# Patient Record
Sex: Male | Born: 1965 | Race: White | Hispanic: No | Marital: Married | Smoking: Never smoker
Health system: Southern US, Community
[De-identification: ages and names within clinical notes are randomized; demographics above are authoritative.]

## PROBLEM LIST (undated history)

## (undated) DIAGNOSIS — J189 Pneumonia, unspecified organism: Secondary | ICD-10-CM

## (undated) DIAGNOSIS — K5792 Diverticulitis of intestine, part unspecified, without perforation or abscess without bleeding: Secondary | ICD-10-CM

## (undated) DIAGNOSIS — K259 Gastric ulcer, unspecified as acute or chronic, without hemorrhage or perforation: Secondary | ICD-10-CM

## (undated) DIAGNOSIS — J302 Other seasonal allergic rhinitis: Secondary | ICD-10-CM

---

## 2001-12-09 ENCOUNTER — Encounter: Payer: Self-pay | Admitting: Gastroenterology

## 2001-12-09 ENCOUNTER — Encounter: Admission: RE | Admit: 2001-12-09 | Discharge: 2001-12-09 | Payer: Self-pay | Admitting: Gastroenterology

## 2010-09-21 ENCOUNTER — Encounter: Payer: Self-pay | Admitting: Family Medicine

## 2012-07-21 ENCOUNTER — Ambulatory Visit (INDEPENDENT_AMBULATORY_CARE_PROVIDER_SITE_OTHER): Payer: 59 | Admitting: Family Medicine

## 2012-07-21 ENCOUNTER — Encounter: Payer: Self-pay | Admitting: Family Medicine

## 2012-07-21 VITALS — BP 130/100 | HR 88 | Temp 98.4°F | Resp 12 | Ht 68.75 in | Wt 176.0 lb

## 2012-07-21 DIAGNOSIS — J4520 Mild intermittent asthma, uncomplicated: Secondary | ICD-10-CM | POA: Insufficient documentation

## 2012-07-21 DIAGNOSIS — Z8711 Personal history of peptic ulcer disease: Secondary | ICD-10-CM | POA: Insufficient documentation

## 2012-07-21 DIAGNOSIS — M546 Pain in thoracic spine: Secondary | ICD-10-CM

## 2012-07-21 DIAGNOSIS — M549 Dorsalgia, unspecified: Secondary | ICD-10-CM

## 2012-07-21 DIAGNOSIS — J45909 Unspecified asthma, uncomplicated: Secondary | ICD-10-CM

## 2012-07-21 MED ORDER — METHOCARBAMOL 500 MG PO TABS: 500.0000 mg | ORAL_TABLET | Freq: Four times a day (QID) | ORAL | Status: DC | PRN

## 2012-07-21 NOTE — Patient Instructions (Addendum)
Consider topical such as Biofreeze Consider muscle massage. Use topical heat  Touch base in 1-2 weeks if no better.

## 2012-07-21 NOTE — Progress Notes (Signed)
  Subjective:    Patient ID: Adam Oconnell, male    DOB: 1966-03-03, 47 y.o.   MRN: [id number]  HPI New patient to establish care Past medical history reviewed. History of mild intermittent asthma and rarely takes albuterol Asthma usually triggered by allergies. Takes no regular medications. No prior surgeries. Family history significant for mother with uterine cancer.  Patient is married. 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 Nonsmoker. Only occasional alcohol use.  Major issue today is some left upper back pain. Present for one month. No injury. Mostly achy pain left trapezius region was radiates somewhat to the shoulder but not below the shoulder region. No pain with full range of motion left shoulder. No upper extremity numbness or weakness. He's tried heat and ibuprofen with minimal relief. Symptoms are somewhat intermittent but can be worse at night  Past Medical History  Diagnosis Date  . Allergy   . Asthma   . Ulcer    No past surgical history on file.  reports that he has never smoked. He does not have any smokeless tobacco history on file. His alcohol and drug histories are not on file. family history includes Cancer (age of onset: 50) in his mother and Heart disease in his maternal uncle. No Known Allergies    Review of Systems  Constitutional: Negative for fever, chills, appetite change, fatigue and unexpected weight change.  Respiratory: Negative for cough and shortness of breath.   Cardiovascular: Negative for chest pain.  Gastrointestinal: Negative for abdominal pain.  Musculoskeletal: Negative for arthralgias.  Skin: Negative for rash.  Neurological: Negative for weakness and numbness.  Hematological: Negative for adenopathy.       Objective:   Physical Exam  Constitutional: He appears well-developed and well-nourished.  Neck: Neck supple. No thyromegaly present.  Cardiovascular: Normal rate and regular rhythm.   Pulmonary/Chest: Effort  normal and breath sounds normal. No respiratory distress. He has no wheezes. He has no rales.  Musculoskeletal: He exhibits no edema.  Full range of motion cervical spine and left shoulder Mild pain with lateral bending to the left otherwise good range of motion without pain  Lymphadenopathy:    He has no cervical adenopathy.  Neurological:  Deep tender reflexes symmetric upper extremities. Full-strength throughout.          Assessment & Plan:  #1 history of mild intermittent asthma. Stable. Continue as needed albuterol #2 left upper back pain. This sounds more muscular. Doubt cervical radiculopathy. Try topical rubs such as bio freeze. Consider muscle massage. Robaxin 500 mg every 6 hours when necessary

## 2012-11-05 ENCOUNTER — Other Ambulatory Visit (INDEPENDENT_AMBULATORY_CARE_PROVIDER_SITE_OTHER): Payer: 59

## 2012-11-05 DIAGNOSIS — Z0000 Encounter for general adult medical examination without abnormal findings: Secondary | ICD-10-CM

## 2012-11-05 LAB — BASIC METABOLIC PANEL
BUN: 15 mg/dL (ref 6–23)
CO2: 27 mEq/L (ref 19–32)
Calcium: 8.9 mg/dL (ref 8.4–10.5)
Chloride: 104 mEq/L (ref 96–112)
Creatinine, Ser: 0.9 mg/dL (ref 0.4–1.5)
GFR: 94.21 mL/min (ref >60.00)
Glucose, Bld: 101 mg/dL — ABNORMAL HIGH (ref 70–99)
Potassium: 4.3 mEq/L (ref 3.5–5.1)
Sodium: 136 mEq/L (ref 135–145)

## 2012-11-05 LAB — LIPID PANEL
Cholesterol: 201 mg/dL — ABNORMAL HIGH (ref 0–200)
HDL: 40.6 mg/dL (ref >39.00)
Total CHOL/HDL Ratio: 5
Triglycerides: 111 mg/dL (ref 0.0–149.0)
VLDL: 22.2 mg/dL (ref 0.0–40.0)

## 2012-11-05 LAB — HEPATIC FUNCTION PANEL
AST: 20 U/L (ref 0–37)
Albumin: 3.8 g/dL (ref 3.5–5.2)
Alkaline Phosphatase: 43 U/L (ref 39–117)
Total Bilirubin: 0.7 mg/dL (ref 0.3–1.2)

## 2012-11-05 LAB — CBC WITH DIFFERENTIAL/PLATELET
Basophils Absolute: 0 10*3/uL (ref 0.0–0.1)
Basophils Relative: 0.6 % (ref 0.0–3.0)
Eosinophils Absolute: 0.3 10*3/uL (ref 0.0–0.7)
Eosinophils Relative: 5.1 % — ABNORMAL HIGH (ref 0.0–5.0)
HCT: 42.8 % (ref 39.0–52.0)
Hemoglobin: 14.3 g/dL (ref 13.0–17.0)
Lymphocytes Relative: 33 % (ref 12.0–46.0)
Lymphs Abs: 2.1 10*3/uL (ref 0.7–4.0)
MCHC: 33.5 g/dL (ref 30.0–36.0)
MCV: 86.8 fl (ref 78.0–100.0)
Monocytes Absolute: 0.4 10*3/uL (ref 0.1–1.0)
Monocytes Relative: 6.4 % (ref 3.0–12.0)
Neutro Abs: 3.5 10*3/uL (ref 1.4–7.7)
Neutrophils Relative %: 54.9 % (ref 43.0–77.0)
Platelets: 298 10*3/uL (ref 150.0–400.0)
RBC: 4.93 Mil/uL (ref 4.22–5.81)
RDW: 13.7 % (ref 11.5–14.6)
WBC: 6.4 10*3/uL (ref 4.5–10.5)

## 2012-11-05 LAB — POCT URINALYSIS DIPSTICK
Bilirubin, UA: NEGATIVE
Glucose, UA: NEGATIVE
Ketones, UA: NEGATIVE
Leukocytes, UA: NEGATIVE
Nitrite, UA: NEGATIVE
Protein, UA: NEGATIVE
Spec Grav, UA: 1.02
Urobilinogen, UA: 0.2
pH, UA: 6.5

## 2012-11-05 LAB — TSH: TSH: 0.67 u[IU]/mL (ref 0.35–5.50)

## 2012-11-06 LAB — LDL CHOLESTEROL, DIRECT: Direct LDL: 151.1 mg/dL

## 2012-11-12 ENCOUNTER — Encounter: Payer: Self-pay | Admitting: Family Medicine

## 2012-11-12 ENCOUNTER — Ambulatory Visit (INDEPENDENT_AMBULATORY_CARE_PROVIDER_SITE_OTHER): Payer: 59 | Admitting: Family Medicine

## 2012-11-12 VITALS — BP 124/80 | HR 95 | Temp 97.8°F | Ht 68.0 in | Wt 178.0 lb

## 2012-11-12 DIAGNOSIS — Z0000 Encounter for general adult medical examination without abnormal findings: Secondary | ICD-10-CM

## 2012-11-12 DIAGNOSIS — R319 Hematuria, unspecified: Secondary | ICD-10-CM

## 2012-11-12 DIAGNOSIS — Z23 Encounter for immunization: Secondary | ICD-10-CM

## 2012-11-12 DIAGNOSIS — R9389 Abnormal findings on diagnostic imaging of other specified body structures: Secondary | ICD-10-CM

## 2012-11-12 DIAGNOSIS — R3989 Other symptoms and signs involving the genitourinary system: Secondary | ICD-10-CM

## 2012-11-12 LAB — POCT URINALYSIS DIPSTICK
Bilirubin, UA: NEGATIVE
Glucose, UA: NEGATIVE
Nitrite, UA: NEGATIVE
Spec Grav, UA: 1.025
pH, UA: 5.5

## 2012-11-12 NOTE — Progress Notes (Signed)
  Subjective:    Patient ID: Adam Oconnell, male    DOB: Jan 31, 1966, 47 y.o.   MRN: [id number]  HPI Patient for complete physical. Generally very healthy. History of mild intermittent asthma and questionable history of remote peptic ulcer. He thinks he had colonoscopy around age 15 secondary to some rectal bleeding. He was told was normal. Last tetanus is unknown. He has not yet had flu vaccine.  Works extremely long hours- frequently 15-16 hours per day. Very little exercise. Nonsmoker. No family history of premature CAD.  Past Medical History  Diagnosis Date  . Allergy   . Asthma   . Ulcer    No past surgical history on file.  reports that he has never smoked. He does not have any smokeless tobacco history on file. His alcohol and drug histories are not on file. family history includes Cancer (age of onset: 62) in his mother; Heart disease in his maternal uncle. No Known Allergies    Review of Systems  Constitutional: Negative for fever, chills, activity change, appetite change, fatigue and unexpected weight change.  HENT: Negative for ear pain, congestion and trouble swallowing.   Eyes: Negative for pain and visual disturbance.  Respiratory: Negative for cough, shortness of breath and wheezing.   Cardiovascular: Negative for chest pain and palpitations.  Gastrointestinal: Negative for nausea, vomiting, abdominal pain, diarrhea, constipation, blood in stool, abdominal distention and rectal pain.  Endocrine: Negative for polydipsia and polyuria.  Genitourinary: Negative for dysuria, hematuria and testicular pain.  Musculoskeletal: Negative for joint swelling and arthralgias.  Skin: Negative for rash.  Neurological: Negative for dizziness, syncope and headaches.  Hematological: Negative for adenopathy.  Psychiatric/Behavioral: Negative for confusion and dysphoric mood.       Objective:   Physical Exam  Constitutional: He is oriented to person, place, and time. He appears  well-developed and well-nourished. No distress.  HENT:  Head: Normocephalic and atraumatic.  Right Ear: External ear normal.  Left Ear: External ear normal.  Mouth/Throat: Oropharynx is clear and moist.  Eyes: Conjunctivae and EOM are normal. Pupils are equal, round, and reactive to light.  Neck: Normal range of motion. Neck supple. No thyromegaly present.  Cardiovascular: Normal rate, regular rhythm and normal heart sounds.   No murmur heard. Pulmonary/Chest: No respiratory distress. He has no wheezes. He has no rales.  Abdominal: Soft. Bowel sounds are normal. He exhibits no distension and no mass. There is no tenderness. There is no rebound and no guarding.  Genitourinary: Rectum normal.  Prostate feels slightly nodular right lobe near center  Musculoskeletal: He exhibits no edema.  Lymphadenopathy:    He has no cervical adenopathy.  Neurological: He is alert and oriented to person, place, and time. He displays normal reflexes. No cranial nerve deficit.  Skin: No rash noted.  Psychiatric: He has a normal mood and affect.          Assessment & Plan:  Complete physical. Tetanus booster given. Flu vaccine given. Labs reviewed. 1+ blood on initial urine dipstick. Repeat urinalysis. If persist will send for urine microscopy to rule out hematuria. Add PSA with exam above. He has mild hyperglycemia. We discussed implications. Work on reducing processed sugars and white starches

## 2012-11-12 NOTE — Patient Instructions (Addendum)

## 2012-11-13 LAB — URINALYSIS, MICROSCOPIC ONLY
Bacteria, UA: NONE SEEN
Casts: NONE SEEN
Crystals: NONE SEEN
Squamous Epithelial / LPF: NONE SEEN

## 2013-04-12 ENCOUNTER — Ambulatory Visit (INDEPENDENT_AMBULATORY_CARE_PROVIDER_SITE_OTHER): Payer: 59 | Admitting: Family Medicine

## 2013-04-12 ENCOUNTER — Encounter: Payer: Self-pay | Admitting: Family Medicine

## 2013-04-12 VITALS — BP 138/90 | HR 90 | Temp 97.9°F | Wt 178.0 lb

## 2013-04-12 DIAGNOSIS — B349 Viral infection, unspecified: Secondary | ICD-10-CM

## 2013-04-12 DIAGNOSIS — B9789 Other viral agents as the cause of diseases classified elsewhere: Secondary | ICD-10-CM

## 2013-04-12 MED ORDER — ALBUTEROL SULFATE HFA 108 (90 BASE) MCG/ACT IN AERS: 1.0000 | INHALATION_SPRAY | Freq: Four times a day (QID) | RESPIRATORY_TRACT | Status: DC | PRN

## 2013-04-12 MED ORDER — HYDROCODONE-HOMATROPINE 5-1.5 MG/5ML PO SYRP: 5.0000 mL | ORAL_SOLUTION | Freq: Four times a day (QID) | ORAL | Status: AC | PRN

## 2013-04-12 NOTE — Progress Notes (Signed)
   Subjective:    Patient ID: Adam Oconnell, male    DOB: September 28, 1965, 48 y.o.   MRN: [id number]  HPI Patient seen for acute illness Onset a couple days ago. He developed cough, body aches, low-grade fever, chills, and some bilateral earache. His fever seemed to break last night. Still occasional chills. No nausea or vomiting. Using over-the-counter cough medications with fairly. Nonsmoker. No wheezing. No dyspnea. Mild sore throat relieved with over-the-counter medications  Past Medical History  Diagnosis Date  . Allergy   . Asthma   . Ulcer    No past surgical history on file.  reports that he has never smoked. He does not have any smokeless tobacco history on file. His alcohol and drug histories are not on file. family history includes Cancer (age of onset: 3560) in his mother; Heart disease in his maternal uncle. No Known Allergies    Review of Systems  Constitutional: Positive for fever, chills and fatigue.  HENT: Positive for congestion and sore throat.   Respiratory: Positive for cough. Negative for shortness of breath and wheezing.        Objective:   Physical Exam  Constitutional: He appears well-developed and well-nourished.  HENT:  Mouth/Throat: Oropharynx is clear and moist.  He has mild cerumen bilaterally otherwise no acute findings  Neck: Neck supple.  Cardiovascular: Normal rate.   Pulmonary/Chest: Effort normal and breath sounds normal. No respiratory distress. He has no wheezes. He has no rales.          Assessment & Plan:  Acute viral illness. Reassurance. Continue over-the-counter medications as needed. Hycodan cough syrup 1 teaspoon each bedtime prn for severe cough.

## 2013-04-12 NOTE — Patient Instructions (Signed)

## 2013-04-12 NOTE — Progress Notes (Signed)
Pre visit review using our clinic review tool, if applicable. No additional management support is needed unless otherwise documented below in the visit note. 

## 2013-11-04 ENCOUNTER — Encounter: Payer: Self-pay | Admitting: Family Medicine

## 2013-11-04 ENCOUNTER — Ambulatory Visit (INDEPENDENT_AMBULATORY_CARE_PROVIDER_SITE_OTHER): Payer: Managed Care, Other (non HMO) | Admitting: Family Medicine

## 2013-11-04 VITALS — BP 120/80 | HR 73 | Temp 97.5°F | Wt 181.0 lb

## 2013-11-04 DIAGNOSIS — J0100 Acute maxillary sinusitis, unspecified: Secondary | ICD-10-CM

## 2013-11-04 MED ORDER — AMOXICILLIN 875 MG PO TABS
875.0000 mg | ORAL_TABLET | Freq: Two times a day (BID) | ORAL | Status: DC
Start: 2013-11-04 — End: 2013-11-29

## 2013-11-04 NOTE — Progress Notes (Signed)
Pre visit review using our clinic review tool, if applicable. No additional management support is needed unless otherwise documented below in the visit note. 

## 2013-11-04 NOTE — Patient Instructions (Signed)

## 2013-11-04 NOTE — Progress Notes (Signed)
   Subjective:    Patient ID: Adam Oconnell, male    DOB: 1965-03-21, 48 y.o.   MRN: [id number]  Sinusitis Associated symptoms include congestion, headaches and sinus pressure. Pertinent negatives include no chills or coughing.   Patient seen with 3 week history of bilateral maxillary sinus pain and pressure, headache, malaise, and greenish nasal discharge. He started with viral URI and initially seemed to be improving and then had relapse. No fever. No cough. Has tried over-the-counter medications without relief. Has not had a sinus infection in years.  Past Medical History  Diagnosis Date  . Allergy   . Asthma   . Ulcer    No past surgical history on file.  reports that he has never smoked. He does not have any smokeless tobacco history on file. His alcohol and drug histories are not on file. family history includes Cancer (age of onset: 74) in his mother; Heart disease in his maternal uncle. No Known Allergies    Review of Systems  Constitutional: Positive for fatigue. Negative for fever and chills.  HENT: Positive for congestion and sinus pressure.   Respiratory: Negative for cough.   Neurological: Positive for headaches.       Objective:   Physical Exam  Constitutional: He appears well-developed and well-nourished.  HENT:  Right Ear: External ear normal.  Left Ear: External ear normal.  Mouth/Throat: Oropharynx is clear and moist.  Neck: Neck supple.  Cardiovascular: Normal rate and regular rhythm.   Pulmonary/Chest: Effort normal and breath sounds normal. No respiratory distress. He has no wheezes. He has no rales.  Lymphadenopathy:    He has no cervical adenopathy.          Assessment & Plan:  Acute sinusitis. Amoxicillin 875 mg twice daily for 10 days. Followup as needed

## 2013-11-23 ENCOUNTER — Other Ambulatory Visit (INDEPENDENT_AMBULATORY_CARE_PROVIDER_SITE_OTHER): Payer: Managed Care, Other (non HMO)

## 2013-11-23 DIAGNOSIS — Z0000 Encounter for general adult medical examination without abnormal findings: Secondary | ICD-10-CM

## 2013-11-23 LAB — BASIC METABOLIC PANEL
BUN: 18 mg/dL (ref 6–23)
CHLORIDE: 104 meq/L (ref 96–112)
CO2: 25 mEq/L (ref 19–32)
Calcium: 9.3 mg/dL (ref 8.4–10.5)
Creatinine, Ser: 1.1 mg/dL (ref 0.4–1.5)
GFR: 76.98 mL/min (ref >60.00)
Glucose, Bld: 95 mg/dL (ref 70–99)
POTASSIUM: 4.7 meq/L (ref 3.5–5.1)
SODIUM: 139 meq/L (ref 135–145)

## 2013-11-23 LAB — POCT URINALYSIS DIPSTICK
Bilirubin, UA: NEGATIVE
GLUCOSE UA: NEGATIVE
KETONES UA: NEGATIVE
Leukocytes, UA: NEGATIVE
Nitrite, UA: NEGATIVE
Protein, UA: NEGATIVE
Spec Grav, UA: 1.015
Urobilinogen, UA: 0.2
pH, UA: 5.5

## 2013-11-23 LAB — TSH: TSH: 0.98 u[IU]/mL (ref 0.35–4.50)

## 2013-11-23 LAB — LIPID PANEL
CHOLESTEROL: 250 mg/dL — AB (ref 0–200)
HDL: 36.1 mg/dL — ABNORMAL LOW (ref >39.00)
LDL Cholesterol: 178 mg/dL — ABNORMAL HIGH (ref 0–99)
NonHDL: 213.9
Total CHOL/HDL Ratio: 7
Triglycerides: 181 mg/dL — ABNORMAL HIGH (ref 0.0–149.0)
VLDL: 36.2 mg/dL (ref 0.0–40.0)

## 2013-11-23 LAB — HEPATIC FUNCTION PANEL
ALBUMIN: 4.2 g/dL (ref 3.5–5.2)
ALK PHOS: 47 U/L (ref 39–117)
ALT: 33 U/L (ref 0–53)
AST: 30 U/L (ref 0–37)
Bilirubin, Direct: 0.1 mg/dL (ref 0.0–0.3)
TOTAL PROTEIN: 7.8 g/dL (ref 6.0–8.3)
Total Bilirubin: 0.9 mg/dL (ref 0.2–1.2)

## 2013-11-24 LAB — CBC WITH DIFFERENTIAL/PLATELET
BASOS ABS: 0 10*3/uL (ref 0.0–0.1)
Basophils Relative: 0.3 % (ref 0.0–3.0)
Eosinophils Absolute: 0.7 10*3/uL (ref 0.0–0.7)
Eosinophils Relative: 9.3 % — ABNORMAL HIGH (ref 0.0–5.0)
HCT: 46.4 % (ref 39.0–52.0)
Hemoglobin: 15.5 g/dL (ref 13.0–17.0)
LYMPHS PCT: 31.4 % (ref 12.0–46.0)
Lymphs Abs: 2.2 10*3/uL (ref 0.7–4.0)
MCHC: 33.4 g/dL (ref 30.0–36.0)
MCV: 89.2 fl (ref 78.0–100.0)
MONOS PCT: 6.4 % (ref 3.0–12.0)
Monocytes Absolute: 0.4 10*3/uL (ref 0.1–1.0)
NEUTROS ABS: 3.7 10*3/uL (ref 1.4–7.7)
NEUTROS PCT: 52.6 % (ref 43.0–77.0)
Platelets: 309 10*3/uL (ref 150.0–400.0)
RBC: 5.2 Mil/uL (ref 4.22–5.81)
RDW: 13.4 % (ref 11.5–15.5)
WBC: 7 10*3/uL (ref 4.0–10.5)

## 2013-11-29 ENCOUNTER — Ambulatory Visit (INDEPENDENT_AMBULATORY_CARE_PROVIDER_SITE_OTHER): Payer: Managed Care, Other (non HMO) | Admitting: Family Medicine

## 2013-11-29 ENCOUNTER — Encounter: Payer: Self-pay | Admitting: Family Medicine

## 2013-11-29 VITALS — BP 134/80 | HR 78 | Temp 97.6°F | Ht 68.0 in | Wt 182.0 lb

## 2013-11-29 DIAGNOSIS — R319 Hematuria, unspecified: Secondary | ICD-10-CM

## 2013-11-29 DIAGNOSIS — Z0000 Encounter for general adult medical examination without abnormal findings: Secondary | ICD-10-CM

## 2013-11-29 DIAGNOSIS — E785 Hyperlipidemia, unspecified: Secondary | ICD-10-CM

## 2013-11-29 LAB — POCT URINALYSIS DIPSTICK
BILIRUBIN UA: NEGATIVE
Glucose, UA: NEGATIVE
Ketones, UA: NEGATIVE
LEUKOCYTES UA: NEGATIVE
Nitrite, UA: NEGATIVE
Protein, UA: NEGATIVE
Spec Grav, UA: 1.01
UROBILINOGEN UA: 0.2
pH, UA: 5.5

## 2013-11-29 NOTE — Progress Notes (Signed)
Pre visit review using our clinic review tool, if applicable. No additional management support is needed unless otherwise documented below in the visit note. 

## 2013-11-29 NOTE — Patient Instructions (Signed)
Fat and Cholesterol Control Diet Fat and cholesterol levels in your blood and organs are influenced by your diet. High levels of fat and cholesterol may lead to diseases of the heart, small and large blood vessels, gallbladder, liver, and pancreas. CONTROLLING FAT AND CHOLESTEROL WITH DIET Although exercise and lifestyle factors are important, your diet is key. That is because certain foods are known to raise cholesterol and others to lower it. The goal is to balance foods for their effect on cholesterol and more importantly, to replace saturated and trans fat with other types of fat, such as monounsaturated fat, polyunsaturated fat, and omega-3 fatty acids. On average, a person should consume no more than 15 to 17 g of saturated fat daily. Saturated and trans fats are considered "bad" fats, and they will raise LDL cholesterol. Saturated fats are primarily found in animal products such as meats, butter, and cream. However, that does not mean you need to give up all your favorite foods. Today, there are good tasting, low-fat, low-cholesterol substitutes for most of the things you like to eat. Choose low-fat or nonfat alternatives. Choose round or loin cuts of red meat. These types of cuts are lowest in fat and cholesterol. Chicken (without the skin), fish, veal, and ground turkey breast are great choices. Eliminate fatty meats, such as hot dogs and salami. Even shellfish have little or no saturated fat. Have a 3 oz (85 g) portion when you eat lean meat, poultry, or fish. Trans fats are also called "partially hydrogenated oils." They are oils that have been scientifically manipulated so that they are solid at room temperature resulting in a longer shelf life and improved taste and texture of foods in which they are added. Trans fats are found in stick margarine, some tub margarines, cookies, crackers, and baked goods.  When baking and cooking, oils are a great substitute for butter. The monounsaturated oils are  especially beneficial since it is believed they lower LDL and raise HDL. The oils you should avoid entirely are saturated tropical oils, such as coconut and palm.  Remember to eat a lot from food groups that are naturally free of saturated and trans fat, including fish, fruit, vegetables, beans, grains (barley, rice, couscous, bulgur wheat), and pasta (without cream sauces).  IDENTIFYING FOODS THAT LOWER FAT AND CHOLESTEROL  Soluble fiber may lower your cholesterol. This type of fiber is found in fruits such as apples, vegetables such as broccoli, potatoes, and carrots, legumes such as beans, peas, and lentils, and grains such as barley. Foods fortified with plant sterols (phytosterol) may also lower cholesterol. You should eat at least 2 g per day of these foods for a cholesterol lowering effect.  Read package labels to identify low-saturated fats, trans fat free, and low-fat foods at the supermarket. Select cheeses that have only 2 to 3 g saturated fat per ounce. Use a heart-healthy tub margarine that is free of trans fats or partially hydrogenated oil. When buying baked goods (cookies, crackers), avoid partially hydrogenated oils. Breads and muffins should be made from whole grains (whole-wheat or whole oat flour, instead of "flour" or "enriched flour"). Buy non-creamy canned soups with reduced salt and no added fats.  FOOD PREPARATION TECHNIQUES  Never deep-fry. If you must fry, either stir-fry, which uses very little fat, or use non-stick cooking sprays. When possible, broil, bake, or roast meats, and steam vegetables. Instead of putting butter or margarine on vegetables, use lemon and herbs, applesauce, and cinnamon (for squash and sweet potatoes). Use nonfat   yogurt, salsa, and low-fat dressings for salads.  LOW-SATURATED FAT / LOW-FAT FOOD SUBSTITUTES Meats / Saturated Fat (g)  Avoid: Steak, marbled (3 oz/85 g) / 11 g  Choose: Steak, lean (3 oz/85 g) / 4 g  Avoid: Hamburger (3 oz/85 g) / 7  g  Choose: Hamburger, lean (3 oz/85 g) / 5 g  Avoid: Ham (3 oz/85 g) / 6 g  Choose: Ham, lean cut (3 oz/85 g) / 2.4 g  Avoid: Chicken, with skin, dark meat (3 oz/85 g) / 4 g  Choose: Chicken, skin removed, dark meat (3 oz/85 g) / 2 g  Avoid: Chicken, with skin, light meat (3 oz/85 g) / 2.5 g  Choose: Chicken, skin removed, light meat (3 oz/85 g) / 1 g Dairy / Saturated Fat (g)  Avoid: Whole milk (1 cup) / 5 g  Choose: Low-fat milk, 2% (1 cup) / 3 g  Choose: Low-fat milk, 1% (1 cup) / 1.5 g  Choose: Skim milk (1 cup) / 0.3 g  Avoid: Hard cheese (1 oz/28 g) / 6 g  Choose: Skim milk cheese (1 oz/28 g) / 2 to 3 g  Avoid: Cottage cheese, 4% fat (1 cup) / 6.5 g  Choose: Low-fat cottage cheese, 1% fat (1 cup) / 1.5 g  Avoid: Ice cream (1 cup) / 9 g  Choose: Sherbet (1 cup) / 2.5 g  Choose: Nonfat frozen yogurt (1 cup) / 0.3 g  Choose: Frozen fruit bar / trace  Avoid: Whipped cream (1 tbs) / 3.5 g  Choose: Nondairy whipped topping (1 tbs) / 1 g Condiments / Saturated Fat (g)  Avoid: Mayonnaise (1 tbs) / 2 g  Choose: Low-fat mayonnaise (1 tbs) / 1 g  Avoid: Butter (1 tbs) / 7 g  Choose: Extra light margarine (1 tbs) / 1 g  Avoid: Coconut oil (1 tbs) / 11.8 g  Choose: Olive oil (1 tbs) / 1.8 g  Choose: Corn oil (1 tbs) / 1.7 g  Choose: Safflower oil (1 tbs) / 1.2 g  Choose: Sunflower oil (1 tbs) / 1.4 g  Choose: Soybean oil (1 tbs) / 2.4 g  Choose: Canola oil (1 tbs) / 1 g Document Released: 02/25/2005 Document Revised: 06/22/2012 Document Reviewed: 05/26/2013 [employer] Patient Information 2015 [employer]. This information is not intended to replace advice given to you by your health care provider. Make sure you discuss any questions you have with your health care provider.  

## 2013-11-29 NOTE — Progress Notes (Signed)
   Subjective:    Patient ID: Adam Oconnell, male    DOB: 24-Oct-1965, 48 y.o.   MRN: [id number]  HPI Patient seen for complete physical. Generally very healthy. Takes no regular medications. He states he had colonoscopy prior to age 65 secondary to hematochezia. He recalls fairly normal results. He does not recall which year he had this but thinks has been less than 10 years. Nonsmoker. Recently resumed running for exercise. He has gained some mild weight over the past year. Tetanus up-to-date. He had flu vaccine through work last week.  Past Medical History  Diagnosis Date  . Allergy   . Asthma   . Ulcer    No past surgical history on file.  reports that he has never smoked. He does not have any smokeless tobacco history on file. His alcohol and drug histories are not on file. family history includes Cancer (age of onset: 16) in his mother; Heart disease in his maternal uncle. No Known Allergies    Review of Systems  Constitutional: Negative for fever, activity change, appetite change and fatigue.  HENT: Negative for congestion, ear pain and trouble swallowing.   Eyes: Negative for pain and visual disturbance.  Respiratory: Negative for cough, shortness of breath and wheezing.   Cardiovascular: Negative for chest pain and palpitations.  Gastrointestinal: Negative for nausea, vomiting, abdominal pain, diarrhea, constipation, blood in stool, abdominal distention and rectal pain.  Genitourinary: Negative for dysuria, hematuria and testicular pain.  Musculoskeletal: Negative for arthralgias and joint swelling.  Skin: Negative for rash.  Neurological: Negative for dizziness, syncope and headaches.  Hematological: Negative for adenopathy.  Psychiatric/Behavioral: Negative for confusion and dysphoric mood.       Objective:   Physical Exam  Constitutional: He is oriented to person, place, and time. He appears well-developed and well-nourished. No distress.  HENT:  Head:  Normocephalic and atraumatic.  Right Ear: External ear normal.  Left Ear: External ear normal.  Mouth/Throat: Oropharynx is clear and moist.  Eyes: Conjunctivae and EOM are normal. Pupils are equal, round, and reactive to light.  Neck: Normal range of motion. Neck supple. No thyromegaly present.  Cardiovascular: Normal rate, regular rhythm and normal heart sounds.   No murmur heard. Pulmonary/Chest: No respiratory distress. He has no wheezes. He has no rales.  Abdominal: Soft. Bowel sounds are normal. He exhibits no distension and no mass. There is no tenderness. There is no rebound and no guarding.  Musculoskeletal: He exhibits no edema.  Lymphadenopathy:    He has no cervical adenopathy.  Neurological: He is alert and oriented to person, place, and time. He displays normal reflexes. No cranial nerve deficit.  Skin: No rash noted.  Psychiatric: He has a normal mood and affect.          Assessment & Plan:  Complete physical. Labs reviewed. His lipids are substantially elevated from last year. History of poor compliance with diet as above. We discussed options including medical therapy versus lifestyle management and he prefers the latter. We'll plan repeat fasting lipid panel in 6 months. Confirm date of last colonoscopy. His urine again this year shows trace blood and will send urine micro-this was negative last year

## 2013-11-30 ENCOUNTER — Ambulatory Visit: Payer: Managed Care, Other (non HMO)

## 2013-11-30 DIAGNOSIS — R319 Hematuria, unspecified: Secondary | ICD-10-CM

## 2013-11-30 LAB — URINALYSIS, MICROSCOPIC ONLY
RBC / HPF: NONE SEEN (ref 0–?)
WBC, UA: NONE SEEN (ref 0–?)

## 2014-04-21 ENCOUNTER — Other Ambulatory Visit (INDEPENDENT_AMBULATORY_CARE_PROVIDER_SITE_OTHER): Payer: Managed Care, Other (non HMO)

## 2014-04-21 DIAGNOSIS — E785 Hyperlipidemia, unspecified: Secondary | ICD-10-CM

## 2014-04-22 ENCOUNTER — Other Ambulatory Visit: Payer: Managed Care, Other (non HMO)

## 2014-04-22 LAB — LIPID PANEL
Cholesterol: 212 mg/dL — ABNORMAL HIGH (ref 0–200)
HDL: 37.5 mg/dL — ABNORMAL LOW (ref >39.00)
NONHDL: 174.5
Total CHOL/HDL Ratio: 6
Triglycerides: 253 mg/dL — ABNORMAL HIGH (ref 0.0–149.0)
VLDL: 50.6 mg/dL — ABNORMAL HIGH (ref 0.0–40.0)

## 2014-04-22 LAB — LDL CHOLESTEROL, DIRECT: LDL DIRECT: 128 mg/dL

## 2014-04-25 ENCOUNTER — Ambulatory Visit: Payer: Managed Care, Other (non HMO) | Admitting: Family Medicine

## 2014-04-25 ENCOUNTER — Encounter: Payer: Self-pay | Admitting: Family Medicine

## 2014-04-25 ENCOUNTER — Ambulatory Visit (INDEPENDENT_AMBULATORY_CARE_PROVIDER_SITE_OTHER): Payer: Managed Care, Other (non HMO) | Admitting: Family Medicine

## 2014-04-25 VITALS — BP 130/80 | HR 87 | Temp 97.8°F | Wt 185.0 lb

## 2014-04-25 DIAGNOSIS — E785 Hyperlipidemia, unspecified: Secondary | ICD-10-CM | POA: Insufficient documentation

## 2014-04-25 NOTE — Progress Notes (Signed)
   Subjective:    Patient ID: Adam Oconnell, male    DOB: 1965-09-02, 49 y.o.   MRN: [id number]  HPI Patient seen for lipid follow-up. At physical back in the fall he had cholesterol 250. He has made some positive lifestyle changes since then. He is starting to exercise more. He has tried restricting his saturated fat intake. No family history of premature CAD. Nonsmoker. No history of diabetes. No history of hypertension. He has no history of cerebrovascular disease or peripheral vascular disease.  Past Medical History  Diagnosis Date  . Allergy   . Asthma   . Ulcer    No past surgical history on file.  reports that he has never smoked. He does not have any smokeless tobacco history on file. His alcohol and drug histories are not on file. family history includes Cancer (age of onset: 360) in his mother; Heart disease in his maternal uncle. No Known Allergies    Review of Systems  Constitutional: Negative for fatigue.  Eyes: Negative for visual disturbance.  Respiratory: Negative for cough, chest tightness and shortness of breath.   Cardiovascular: Negative for chest pain, palpitations and leg swelling.  Endocrine: Negative for polydipsia and polyuria.  Neurological: Negative for dizziness, syncope, weakness, light-headedness and headaches.       Objective:   Physical Exam  Constitutional: He appears well-developed and well-nourished.  Neck: Neck supple. No thyromegaly present.  Cardiovascular: Normal rate and regular rhythm.   Pulmonary/Chest: Effort normal and breath sounds normal. No respiratory distress. He has no wheezes. He has no rales.  Musculoskeletal: He exhibits no edema.          Assessment & Plan:  Dyslipidemia. Labs reviewed. His cholesterol is back down to 212 with HDL 37 and elevated triglycerides. His 10% risk of CAD event is 6%. He is not interested in statin therapy at this point. Continue lifestyle management. Recheck lipids at follow-up for physical  next fall. Handout on triglycerides given

## 2014-04-25 NOTE — Progress Notes (Signed)
Pre visit review using our clinic review tool, if applicable. No additional management support is needed unless otherwise documented below in the visit note. 

## 2014-04-25 NOTE — Patient Instructions (Signed)

## 2014-08-03 ENCOUNTER — Encounter: Payer: Self-pay | Admitting: Family Medicine

## 2014-08-03 ENCOUNTER — Ambulatory Visit (INDEPENDENT_AMBULATORY_CARE_PROVIDER_SITE_OTHER): Payer: Managed Care, Other (non HMO) | Admitting: Family Medicine

## 2014-08-03 VITALS — BP 130/80 | HR 90 | Temp 98.2°F | Wt 180.0 lb

## 2014-08-03 DIAGNOSIS — M542 Cervicalgia: Secondary | ICD-10-CM

## 2014-08-03 MED ORDER — PREDNISONE 10 MG PO TABS: ORAL_TABLET | ORAL | Status: DC

## 2014-08-03 NOTE — Progress Notes (Signed)
   Subjective:    Patient ID: Adam Oconnell, male    DOB: Jul 05, 1965, 49 y.o.   MRN: [id number]  HPI Patient seen with right-sided neck pain. He was moving some furniture about 1 month ago and noticed a burning/ pulling sensation lower cervical region. He's had some pain intermittently since then. He notices occasional soreness to touch. He's tried some Advil with mild relief. Denies any upper extremity numbness or weakness. He has occasional associated headache with his pain. He is not had any nausea or vomiting.  Past Medical History  Diagnosis Date  . Allergy   . Asthma   . Ulcer    No past surgical history on file.  reports that he has never smoked. He does not have any smokeless tobacco history on file. His alcohol and drug histories are not on file. family history includes Cancer (age of onset: 4360) in his mother; Heart disease in his maternal uncle. No Known Allergies    Review of Systems  Constitutional: Negative for fever and chills.  Musculoskeletal: Positive for neck pain.  Neurological: Negative for weakness and numbness.       Objective:   Physical Exam  Constitutional: He appears well-developed and well-nourished.  Cardiovascular: Normal rate and regular rhythm.   Pulmonary/Chest: Effort normal and breath sounds normal. No respiratory distress. He has no wheezes. He has no rales.  Musculoskeletal:  Full range of motion cervical spine. No spinal tenderness. No trapezius tenderness. Minimal right paracervical muscular tenderness.  Neurological:  Full strength upper extremities. Symmetric reflexes.          Assessment & Plan:  Right neck pain. Suspect cervical strain. Prednisone taper. Continue to alternate heat and ice. He will try topical rub such as biofreeze. Consider physical therapy if no better in 1 to 2 weeks

## 2014-08-03 NOTE — Progress Notes (Signed)
Pre visit review using our clinic review tool, if applicable. No additional management support is needed unless otherwise documented below in the visit note. 

## 2014-10-11 ENCOUNTER — Encounter: Payer: Self-pay | Admitting: Family Medicine

## 2014-10-11 ENCOUNTER — Ambulatory Visit (INDEPENDENT_AMBULATORY_CARE_PROVIDER_SITE_OTHER): Payer: Managed Care, Other (non HMO) | Admitting: Family Medicine

## 2014-10-11 VITALS — BP 118/80 | HR 71 | Temp 98.2°F | Wt 178.0 lb

## 2014-10-11 DIAGNOSIS — J0110 Acute frontal sinusitis, unspecified: Secondary | ICD-10-CM | POA: Diagnosis not present

## 2014-10-11 MED ORDER — AMOXICILLIN-POT CLAVULANATE 875-125 MG PO TABS: 1.0000 | ORAL_TABLET | Freq: Two times a day (BID) | ORAL | Status: DC

## 2014-10-11 NOTE — Patient Instructions (Signed)

## 2014-10-11 NOTE — Progress Notes (Signed)
   Subjective:    Patient ID: Adam Oconnell, male    DOB: [birth year], 49 y.o.   MRN: [id number]  HPI Patient seen with right frontal sinus pressure and greenish brownish discharge over the past several days. About 3 weeks ago developed typical cold symptoms. Seemed to be improving then had relapse with symptoms above plus intermittent headache and increased malaise. Occasional dry cough. No fevers or chills. Has not taken any decongestants.  Past Medical History  Diagnosis Date  . Allergy   . Asthma   . Ulcer    No past surgical history on file.  reports that he has never smoked. He does not have any smokeless tobacco history on file. His alcohol and drug histories are not on file. family history includes Cancer (age of onset: 32) in his mother; Heart disease in his maternal uncle. No Known Allergies    Review of Systems  Constitutional: Positive for fatigue. Negative for fever and chills.  HENT: Positive for congestion and sinus pressure. Negative for sore throat.   Respiratory: Positive for cough.   Neurological: Positive for headaches.       Objective:   Physical Exam  Constitutional: He appears well-developed and well-nourished.  HENT:  Right Ear: External ear normal.  Left Ear: External ear normal.  Mouth/Throat: Oropharynx is clear and moist.  Swollen turbinates bilaterally. No purulent secretions  Neck: Neck supple.  Cardiovascular: Normal rate and regular rhythm.   Pulmonary/Chest: Effort normal and breath sounds normal. No respiratory distress. He has no wheezes. He has no rales.  Lymphadenopathy:    He has no cervical adenopathy.          Assessment & Plan:  Acute sinusitis. Augmentin 875 mg twice daily for 10 days. Consider over-the-counter decongestion. Stay well-hydrated. Follow-up when necessary

## 2014-10-11 NOTE — Progress Notes (Signed)
Pre visit review using our clinic review tool, if applicable. No additional management support is needed unless otherwise documented below in the visit note. 

## 2014-12-07 ENCOUNTER — Other Ambulatory Visit (INDEPENDENT_AMBULATORY_CARE_PROVIDER_SITE_OTHER): Payer: Managed Care, Other (non HMO)

## 2014-12-07 DIAGNOSIS — Z0000 Encounter for general adult medical examination without abnormal findings: Secondary | ICD-10-CM | POA: Diagnosis not present

## 2014-12-07 LAB — BASIC METABOLIC PANEL
BUN: 14 mg/dL (ref 6–23)
CHLORIDE: 106 meq/L (ref 96–112)
CO2: 29 mEq/L (ref 19–32)
CREATININE: 1.08 mg/dL (ref 0.40–1.50)
Calcium: 9.2 mg/dL (ref 8.4–10.5)
GFR: 76.66 mL/min (ref >60.00)
Glucose, Bld: 88 mg/dL (ref 70–99)
POTASSIUM: 4.5 meq/L (ref 3.5–5.1)
Sodium: 141 mEq/L (ref 135–145)

## 2014-12-07 LAB — CBC WITH DIFFERENTIAL/PLATELET
BASOS PCT: 0.7 % (ref 0.0–3.0)
Basophils Absolute: 0.1 10*3/uL (ref 0.0–0.1)
EOS ABS: 0.4 10*3/uL (ref 0.0–0.7)
Eosinophils Relative: 6.3 % — ABNORMAL HIGH (ref 0.0–5.0)
HEMATOCRIT: 45.1 % (ref 39.0–52.0)
HEMOGLOBIN: 14.9 g/dL (ref 13.0–17.0)
LYMPHS PCT: 39.8 % (ref 12.0–46.0)
Lymphs Abs: 2.9 10*3/uL (ref 0.7–4.0)
MCHC: 33 g/dL (ref 30.0–36.0)
MCV: 86.4 fl (ref 78.0–100.0)
Monocytes Absolute: 0.4 10*3/uL (ref 0.1–1.0)
Monocytes Relative: 5.9 % (ref 3.0–12.0)
Neutro Abs: 3.4 10*3/uL (ref 1.4–7.7)
Neutrophils Relative %: 47.3 % (ref 43.0–77.0)
Platelets: 288 10*3/uL (ref 150.0–400.0)
RBC: 5.22 Mil/uL (ref 4.22–5.81)
RDW: 13.6 % (ref 11.5–15.5)
WBC: 7.2 10*3/uL (ref 4.0–10.5)

## 2014-12-07 LAB — TSH: TSH: 1.85 u[IU]/mL (ref 0.35–4.50)

## 2014-12-07 LAB — HEPATIC FUNCTION PANEL
ALBUMIN: 4.3 g/dL (ref 3.5–5.2)
ALK PHOS: 45 U/L (ref 39–117)
ALT: 22 U/L (ref 0–53)
AST: 16 U/L (ref 0–37)
Bilirubin, Direct: 0.1 mg/dL (ref 0.0–0.3)
TOTAL PROTEIN: 6.9 g/dL (ref 6.0–8.3)
Total Bilirubin: 0.4 mg/dL (ref 0.2–1.2)

## 2014-12-07 LAB — LIPID PANEL
CHOL/HDL RATIO: 5
CHOLESTEROL: 207 mg/dL — AB (ref 0–200)
HDL: 38.5 mg/dL — AB (ref >39.00)
LDL CALC: 139 mg/dL — AB (ref 0–99)
NonHDL: 168.81
TRIGLYCERIDES: 147 mg/dL (ref 0.0–149.0)
VLDL: 29.4 mg/dL (ref 0.0–40.0)

## 2014-12-07 LAB — PSA: PSA: 1.37 ng/mL (ref 0.10–4.00)

## 2014-12-13 ENCOUNTER — Encounter: Payer: Managed Care, Other (non HMO) | Admitting: Family Medicine

## 2014-12-13 DIAGNOSIS — Z0289 Encounter for other administrative examinations: Secondary | ICD-10-CM

## 2014-12-21 ENCOUNTER — Encounter: Payer: Self-pay | Admitting: Family Medicine

## 2014-12-21 ENCOUNTER — Ambulatory Visit (INDEPENDENT_AMBULATORY_CARE_PROVIDER_SITE_OTHER): Payer: Managed Care, Other (non HMO) | Admitting: Family Medicine

## 2014-12-21 VITALS — BP 142/90 | HR 94 | Temp 98.7°F | Ht 68.0 in | Wt 179.8 lb

## 2014-12-21 DIAGNOSIS — Z0000 Encounter for general adult medical examination without abnormal findings: Secondary | ICD-10-CM

## 2014-12-21 NOTE — Progress Notes (Signed)
   Subjective:    Patient ID: Adam Oconnell, male    DOB: Oct 04, 1965, 49 y.o.   MRN: [id number]  HPI   Here for complete physical. Takes no regular medications. He had colonoscopy prior to age 49 secondary to hematochezia and he thinks this wahasless than 10 years a  go. He is still not confirmed date. Tetanus up-to-date. Had flu vaccine couple weeks ago. No consistent exercise.  Past Medical History  Diagnosis Date  . Allergy   . Asthma   . Ulcer    No past surgical history on file.  reports that he has never smoked. He does not have any smokeless tobacco history on file. His alcohol and drug histories are not on file. family history includes Cancer (age of onset: 7560) in his mother; Heart disease in his maternal uncle; Heart disease (age of onset: 2874) in his mother. No Known Allergies    Review of Systems  Constitutional: Negative for fever, activity change, appetite change and fatigue.  HENT: Negative for congestion, ear pain and trouble swallowing.   Eyes: Negative for pain and visual disturbance.  Respiratory: Negative for cough, shortness of breath and wheezing.   Cardiovascular: Negative for chest pain and palpitations.  Gastrointestinal: Negative for nausea, vomiting, abdominal pain, diarrhea, constipation, blood in stool, abdominal distention and rectal pain.  Genitourinary: Negative for dysuria, hematuria and testicular pain.  Musculoskeletal: Negative for joint swelling and arthralgias.  Skin: Negative for rash.  Neurological: Negative for dizziness, syncope and headaches.  Hematological: Negative for adenopathy.  Psychiatric/Behavioral: Negative for confusion and dysphoric mood.       Objective:   Physical Exam  Constitutional: He is oriented to person, place, and time. He appears well-developed and well-nourished. No distress.  HENT:  Head: Normocephalic and atraumatic.  Right Ear: External ear normal.  Left Ear: External ear normal.  Mouth/Throat: Oropharynx  is clear and moist.  Eyes: Conjunctivae and EOM are normal. Pupils are equal, round, and reactive to light.  Neck: Normal range of motion. Neck supple. No thyromegaly present.  Cardiovascular: Normal rate, regular rhythm and normal heart sounds.   No murmur heard. Pulmonary/Chest: No respiratory distress. He has no wheezes. He has no rales.  Abdominal: Soft. Bowel sounds are normal. He exhibits no distension and no mass. There is no tenderness. There is no rebound and no guarding.  Musculoskeletal: He exhibits no edema.  Lymphadenopathy:    He has no cervical adenopathy.  Neurological: He is alert and oriented to person, place, and time. He displays normal reflexes. No cranial nerve deficit.  Skin: No rash noted.  Psychiatric: He has a normal mood and affect.          Assessment & Plan:  Complete physical. Labs reviewed. Still has mild hyperlipidemia but overall improving. We discussed pros and cons of statin therapy and at this point he wishes to focus on lifestyle management. Confirm date of last colonoscopy.

## 2014-12-21 NOTE — Patient Instructions (Signed)
Fat and Cholesterol Restricted Diet High levels of fat and cholesterol in your blood may lead to various health problems, such as diseases of the heart, blood vessels, gallbladder, liver, and pancreas. Fats are concentrated sources of energy that come in various forms. Certain types of fat, including saturated fat, may be harmful in excess. Cholesterol is a substance needed by your body in small amounts. Your body makes all the cholesterol it needs. Excess cholesterol comes from the food you eat. When you have high levels of cholesterol and saturated fat in your blood, health problems can develop because the excess fat and cholesterol will gather along the walls of your blood vessels, causing them to narrow. Choosing the right foods will help you control your intake of fat and cholesterol. This will help keep the levels of these substances in your blood within normal limits and reduce your risk of disease. WHAT IS MY PLAN? Your health care provider recommends that you:  Get no more than __________ % of the total calories in your daily diet from fat.  Limit your intake of saturated fat to less than ______% of your total calories each day.  Limit the amount of cholesterol in your diet to less than _________mg per day. WHAT TYPES OF FAT SHOULD I CHOOSE?  Choose healthy fats more often. Choose monounsaturated and polyunsaturated fats, such as olive and canola oil, flaxseeds, walnuts, almonds, and seeds.  Eat more omega-3 fats. Good choices include salmon, mackerel, sardines, tuna, flaxseed oil, and ground flaxseeds. Aim to eat fish at least two times a week.  Limit saturated fats. Saturated fats are primarily found in animal products, such as meats, butter, and cream. Plant sources of saturated fats include palm oil, palm kernel oil, and coconut oil.  Avoid foods with partially hydrogenated oils in them. These contain trans fats. Examples of foods that contain trans fats are stick margarine, some tub  margarines, cookies, crackers, and other baked goods. WHAT GENERAL GUIDELINES DO I NEED TO FOLLOW? These guidelines for healthy eating will help you control your intake of fat and cholesterol:  Check food labels carefully to identify foods with trans fats or high amounts of saturated fat.  Fill one half of your plate with vegetables and green salads.  Fill one fourth of your plate with whole grains. Look for the word "whole" as the first word in the ingredient list.  Fill one fourth of your plate with lean protein foods.  Limit fruit to two servings a day. Choose fruit instead of juice.  Eat more foods that contain soluble fiber. Examples of foods that contain this type of fiber are apples, broccoli, carrots, beans, peas, and barley. Aim to get 20-30 g of fiber per day.  Eat more home-cooked food and less restaurant, buffet, and fast food.  Limit or avoid alcohol.  Limit foods high in starch and sugar.  Limit fried foods.  Cook foods using methods other than frying. Baking, boiling, grilling, and broiling are all great options.  Lose weight if you are overweight. Losing just 5-10% of your initial body weight can help your overall health and prevent diseases such as diabetes and heart disease. WHAT FOODS CAN I EAT? Grains Whole grains, such as whole wheat or whole grain breads, crackers, cereals, and pasta. Unsweetened oatmeal, bulgur, barley, quinoa, or brown rice. Corn or whole wheat flour tortillas. Vegetables Fresh or frozen vegetables (raw, steamed, roasted, or grilled). Green salads. Fruits All fresh, canned (in natural juice), or frozen fruits. Meat and  Other Protein Products Ground beef (85% or leaner), grass-fed beef, or beef trimmed of fat. Skinless chicken or [place]. Ground chicken or [place]. Pork trimmed of fat. All fish and seafood. Eggs. Dried beans, peas, or lentils. Unsalted nuts or seeds. Unsalted canned or dry beans. Dairy Low-fat dairy products, such as skim or  1% milk, 2% or reduced-fat cheeses, low-fat ricotta or cottage cheese, or plain low-fat yogurt. Fats and Oils Tub margarines without trans fats. Light or reduced-fat mayonnaise and salad dressings. Avocado. Olive, canola, sesame, or safflower oils. Natural peanut or almond butter (choose ones without added sugar and oil). The items listed above may not be a complete list of recommended foods or beverages. Contact your dietitian for more options. WHAT FOODS ARE NOT RECOMMENDED? Grains White bread. White pasta. White rice. Cornbread. Bagels, pastries, and croissants. Crackers that contain trans fat. Vegetables White potatoes. Corn. Creamed or fried vegetables. Vegetables in a cheese sauce. Fruits Dried fruits. Canned fruit in light or heavy syrup. Fruit juice. Meat and Other Protein Products Fatty cuts of meat. Ribs, chicken wings, bacon, sausage, bologna, salami, chitterlings, fatback, hot dogs, bratwurst, and packaged luncheon meats. Liver and organ meats. Dairy Whole or 2% milk, cream, half-and-half, and cream cheese. Whole milk cheeses. Whole-fat or sweetened yogurt. Full-fat cheeses. Nondairy creamers and whipped toppings. Processed cheese, cheese spreads, or cheese curds. Sweets and Desserts Corn syrup, sugars, honey, and molasses. Candy. Jam and jelly. Syrup. Sweetened cereals. Cookies, pies, cakes, donuts, muffins, and ice cream. Fats and Oils Butter, stick margarine, lard, shortening, ghee, or bacon fat. Coconut, palm kernel, or palm oils. Beverages Alcohol. Sweetened drinks (such as sodas, lemonade, and fruit drinks or punches). The items listed above may not be a complete list of foods and beverages to avoid. Contact your dietitian for more information.   This information is not intended to replace advice given to you by your health care provider. Make sure you discuss any questions you have with your health care provider.   Document Released: 02/25/2005 Document Revised: 03/18/2014  Document Reviewed: 05/26/2013 Elsevier Interactive Patient Education [employer]2016 [employer] Inc.  Confirm date of last colonoscopy.

## 2014-12-21 NOTE — Progress Notes (Signed)
Pre visit review using our clinic review tool, if applicable. No additional management support is needed unless otherwise documented below in the visit note. 

## 2015-04-21 ENCOUNTER — Encounter: Payer: Self-pay | Admitting: Family Medicine

## 2015-04-21 ENCOUNTER — Ambulatory Visit (INDEPENDENT_AMBULATORY_CARE_PROVIDER_SITE_OTHER): Payer: Managed Care, Other (non HMO) | Admitting: Family Medicine

## 2015-04-21 VITALS — BP 122/82 | HR 84 | Temp 97.7°F | Ht 68.0 in | Wt 182.0 lb

## 2015-04-21 DIAGNOSIS — J0100 Acute maxillary sinusitis, unspecified: Secondary | ICD-10-CM

## 2015-04-21 MED ORDER — AMOXICILLIN-POT CLAVULANATE 875-125 MG PO TABS: 1.0000 | ORAL_TABLET | Freq: Two times a day (BID) | ORAL | Status: DC

## 2015-04-21 NOTE — Patient Instructions (Signed)

## 2015-04-21 NOTE — Progress Notes (Signed)
   Subjective:    Patient ID: Adam Oconnell, male    DOB: 01/29/1966, 50 y.o.   MRN: [id number]  HPI Acute visit. Patient seen with concern for possible acute sinusitis 3 weeks ago he developed typical cold-like symptoms. He was improving but then about a week and half ago developed increased left facial pain, postnasal drip, intermittent headaches, bloody to greenish nasal discharge and increased malaise. Occasional dry cough. No nausea or vomiting. Symptoms tend any worse early morning Not improved with over-the-counter medications  Past Medical History  Diagnosis Date  . Allergy   . Asthma   . Ulcer    No past surgical history on file.  reports that he has never smoked. He does not have any smokeless tobacco history on file. His alcohol and drug histories are not on file. family history includes Cancer (age of onset: 7) in his mother; Heart disease in his maternal uncle; Heart disease (age of onset: 76) in his mother. No Known Allergies     Review of Systems  Constitutional: Positive for fatigue. Negative for fever and chills.  HENT: Positive for congestion and sinus pressure.   Respiratory: Positive for cough.   Neurological: Positive for headaches.       Objective:   Physical Exam  Constitutional: He appears well-developed and well-nourished.  HENT:  Right Ear: External ear normal.  Left Ear: External ear normal.  Mouth/Throat: Oropharynx is clear and moist.  Erythematous nasal mucosa with yellow mucus left naris  Neck: Neck supple.  Cardiovascular: Normal rate and regular rhythm.   Pulmonary/Chest: Effort normal and breath sounds normal. No respiratory distress. He has no wheezes. He has no rales.          Assessment & Plan:  Probable acute left maxillary sinusitis. Augmentin 875 mg twice daily for 10 days. Stay well-hydrated. follow-up as needed

## 2015-04-21 NOTE — Progress Notes (Signed)
Pre visit review using our clinic review tool, if applicable. No additional management support is needed unless otherwise documented below in the visit note. 

## 2015-05-01 ENCOUNTER — Telehealth: Payer: Self-pay | Admitting: Family Medicine

## 2015-05-01 MED ORDER — AMOXICILLIN-POT CLAVULANATE 875-125 MG PO TABS: 1.0000 | ORAL_TABLET | Freq: Two times a day (BID) | ORAL | Status: DC

## 2015-05-01 NOTE — Telephone Encounter (Signed)
Pt call to say he was in on 04/21/15. He call to say the antibiotic did not clear up the sinus infection and was told to call back if it did not clear up .Marland Kitchen     Pharmacy ; [facility] [address]

## 2015-05-01 NOTE — Telephone Encounter (Signed)
Patient is aware and Rx sent 

## 2015-05-01 NOTE — Telephone Encounter (Signed)
Lets refill Augmentin one po bid for 10 more days and if not better after that would consider limited CT of sinuses.

## 2015-08-24 ENCOUNTER — Ambulatory Visit: Payer: Managed Care, Other (non HMO) | Admitting: Family Medicine

## 2015-11-29 ENCOUNTER — Encounter: Payer: Self-pay | Admitting: Family Medicine

## 2015-11-29 ENCOUNTER — Ambulatory Visit (INDEPENDENT_AMBULATORY_CARE_PROVIDER_SITE_OTHER): Payer: Managed Care, Other (non HMO) | Admitting: Family Medicine

## 2015-11-29 VITALS — BP 120/80 | HR 88 | Temp 98.5°F | Ht 68.0 in | Wt 181.8 lb

## 2015-11-29 DIAGNOSIS — J029 Acute pharyngitis, unspecified: Secondary | ICD-10-CM | POA: Diagnosis not present

## 2015-11-29 LAB — POCT RAPID STREP A (OFFICE): RAPID STREP A SCREEN: NEGATIVE

## 2015-11-29 MED ORDER — LIDOCAINE VISCOUS 2 % MT SOLN: 20.0000 mL | OROMUCOSAL | 0 refills | Status: DC | PRN

## 2015-11-29 NOTE — Progress Notes (Signed)
Subjective:     Patient ID: Adam Oconnell, male   DOB: 1966-01-01, 50 y.o.   MRN: [id number]  HPI Acute visit for sore throat. No sick contacts. Onset couple days ago. Mostly sore throat symptoms. He had some mild body aches and questionable subjective fever. No nausea or vomiting. No nasal congestion. No cough. Prior history of strep but none recently. No nausea or vomiting. No skin rash.  Past Medical History:  Diagnosis Date  . Allergy   . Asthma   . Ulcer    No past surgical history on file.  reports that he has never smoked. He does not have any smokeless tobacco history on file. His alcohol and drug histories are not on file. family history includes Cancer (age of onset: 4660) in his mother; Heart disease in his maternal uncle; Heart disease (age of onset: 474) in his mother. No Known Allergies   Review of Systems  Constitutional: Positive for chills, fatigue and fever.  HENT: Positive for sore throat. Negative for congestion.   Respiratory: Negative for cough.   Skin: Negative for rash.       Objective:   Physical Exam  Constitutional: He appears well-developed and well-nourished.  HENT:  Right Ear: External ear normal.  Left Ear: External ear normal.  Mild posterior pharynx erythema. No evidence for peritonsillar abscess. He has a couple aphthous type ulcers right tonsillar region.  Neck: Neck supple.  Cardiovascular: Normal rate and regular rhythm.   Pulmonary/Chest: Effort normal and breath sounds normal. No respiratory distress. He has no wheezes. He has no rales.  Lymphadenopathy:    He has no cervical adenopathy.       Assessment:     Pharyngitis. Rapid strep negative. Suspect viral    Plan:     -Saltwater gargles as needed -Continue Tylenol or Advil for symptom relief -Prescription for Viscous Xylocaine to use only if he has severe symptoms  Kristian CoveyBruce W Burchette MD [facility]

## 2015-11-29 NOTE — Progress Notes (Signed)
Pre visit review using our clinic review tool, if applicable. No additional management support is needed unless otherwise documented below in the visit note. 

## 2015-11-29 NOTE — Patient Instructions (Signed)

## 2016-08-22 ENCOUNTER — Encounter: Payer: Self-pay | Admitting: Family Medicine

## 2016-08-22 ENCOUNTER — Ambulatory Visit (INDEPENDENT_AMBULATORY_CARE_PROVIDER_SITE_OTHER): Payer: Managed Care, Other (non HMO) | Admitting: Family Medicine

## 2016-08-22 VITALS — BP 126/88 | HR 98 | Temp 98.7°F | Wt 176.2 lb

## 2016-08-22 DIAGNOSIS — J302 Other seasonal allergic rhinitis: Secondary | ICD-10-CM

## 2016-08-22 DIAGNOSIS — J029 Acute pharyngitis, unspecified: Secondary | ICD-10-CM

## 2016-08-22 LAB — POCT RAPID STREP A (OFFICE): Rapid Strep A Screen: NEGATIVE

## 2016-08-22 NOTE — Patient Instructions (Signed)
It was a pleasure to see you today.  Your symptoms today are most likely caused by viral illness. Please drink plenty of water enough for your urine to be pale yellow or clear. You may use Tylenol 325 mg every 6 hours as needed or ibuprofen 600 mg every 6 hours.   Follow-up for evaluation if your symptoms do not improve in 3-4 days, worsen, or you develop a fever greater than 100.  Please use flonase and allegra for symptoms of allergies

## 2016-08-22 NOTE — Progress Notes (Signed)
Patient ID: Adam Oconnell, male   DOB: 12/03/1965, 51 y.o.   MRN: [id number]  PCP: Kristian CoveyBurchette, Bruce W, MD  Subjective:  Adam Oconnell is a 51 y.o. year old very pleasant male patient who presents with including , sore throat that has improved, fever per patient report with Tmax of 101, right ear pressure, sinus pressure/pain, mild body aches, itchy/watery eyes. -started: 2 days ago, symptoms are worsening -previous treatments: acetaminophen  -sick contacts/travel/risks: denies flu exposure.  -Hx of: allergies  ROS-denies SOB, NVD, tooth pain, cough, rash  Pertinent Past Medical History- asthma  Medications- reviewed  Current Outpatient Prescriptions  Medication Sig Dispense Refill  . albuterol (PROVENTIL HFA) 108 (90 BASE) MCG/ACT inhaler Inhale 1-2 puffs into the lungs every 6 (six) hours as needed. 8.5 g 3  . lidocaine (XYLOCAINE) 2 % solution Use as directed 20 mLs in the mouth or throat as needed for mouth pain. (Patient not taking: Reported on 08/22/2016) 100 mL 0   No current facility-administered medications for this visit.     Objective: BP 126/88 (BP Location: Left Arm, Patient Position: Sitting, Cuff Size: Normal)   Pulse 98   Temp 98.7 F (37.1 C) (Oral)   Wt 176 lb 3.2 oz (79.9 kg)   SpO2 97%   BMI 26.79 kg/m  Gen: NAD, resting comfortably HEENT: Turbinates erythematous, TM normal bilaterally, pharynx mildly erythematous with no tonsilar exudate or edema, + sinus tenderness CV: RRR no murmurs rubs or gallops Lungs: CTAB no crackles, wheeze, rhonchi Abdomen: soft/nontender/nondistended/normal bowel sounds. No rebound or guarding.  Ext: no edema Skin: warm, dry, no rash Neuro: grossly normal, moves all extremities  Assessment/Plan:  1. Sore throat Rapid Strep negative; suspect viral in nature Advised advil or tylenol for pain; salt water gargles   2. Seasonal allergic rhinitis, unspecified trigger Flonase and allegra for symptoms   We discussed that we  did not find any infection that had higher probability of being bacterial such as pneumonia or strep throat. We discussed signs that bacterial infection may have developed particularly fever or shortness of breath. Likely course of 1-2 weeks.  Finally, we reviewed reasons to return to care including if symptoms worsen or persist or new concerns arise- once again particularly shortness of breath or fever.   Inez CatalinaJulia Ann Panagiota Perfetti, FNP

## 2016-10-28 ENCOUNTER — Encounter: Payer: Self-pay | Admitting: Family Medicine

## 2016-10-28 ENCOUNTER — Ambulatory Visit (INDEPENDENT_AMBULATORY_CARE_PROVIDER_SITE_OTHER): Payer: Managed Care, Other (non HMO) | Admitting: Family Medicine

## 2016-10-28 VITALS — BP 132/82 | HR 83 | Temp 98.3°F | Ht 68.0 in | Wt 182.6 lb

## 2016-10-28 DIAGNOSIS — M545 Low back pain, unspecified: Secondary | ICD-10-CM

## 2016-10-28 MED ORDER — METHYLPREDNISOLONE 4 MG PO TBPK: ORAL_TABLET | ORAL | 0 refills | Status: DC

## 2016-10-28 MED ORDER — CYCLOBENZAPRINE HCL 10 MG PO TABS: 10.0000 mg | ORAL_TABLET | Freq: Three times a day (TID) | ORAL | 0 refills | Status: DC | PRN

## 2016-10-28 NOTE — Progress Notes (Signed)
   Subjective:    Patient ID: Adam Oconnell, male    DOB: [birth year], 51 y.o.   MRN: [id number]  HPI Here for 3 weeks of left lower back pain. No hx of trauma, he just woke up in the middle of the night with this pain. It is sharp but not severe. No radiation to the legs. Using heat and Ibuprofen with no relief. Sitting still is not too bad, but any sort of movement increases the pain.    Review of Systems  Constitutional: Negative.   Respiratory: Negative.   Cardiovascular: Negative.   Gastrointestinal: Negative.   Genitourinary: Negative.   Musculoskeletal: Positive for back pain.       Objective:   Physical Exam  Constitutional: He is oriented to person, place, and time. He appears well-developed and well-nourished.  In mild pain, walks slowly but without a limp   Cardiovascular: Normal rate, regular rhythm, normal heart sounds and intact distal pulses.   Pulmonary/Chest: Effort normal and breath sounds normal. No respiratory distress. He has no wheezes. He has no rales.  Musculoskeletal:  He is mildly tender in the left lower back and a lot of spasm is present. ROM is reduced by pain. Negative SLR.   Neurological: He is alert and oriented to person, place, and time.          Assessment & Plan:  Low back pain. Use moist heat. Try Flexeril and a Medrol dose pack. Recheck prn.  Gershon Crane, MD

## 2016-11-06 ENCOUNTER — Ambulatory Visit (INDEPENDENT_AMBULATORY_CARE_PROVIDER_SITE_OTHER): Payer: Managed Care, Other (non HMO)

## 2016-11-06 ENCOUNTER — Ambulatory Visit
Admission: EM | Admit: 2016-11-06 | Discharge: 2016-11-06 | Disposition: A | Discharge status: Home / Self Care | Payer: Managed Care, Other (non HMO) | Attending: Emergency Medicine | Admitting: Emergency Medicine

## 2016-11-06 DIAGNOSIS — M25461 Effusion, right knee: Secondary | ICD-10-CM

## 2016-11-06 DIAGNOSIS — S8991XA Unspecified injury of right lower leg, initial encounter: Secondary | ICD-10-CM | POA: Diagnosis not present

## 2016-11-06 MED ORDER — HYDROCODONE-ACETAMINOPHEN 5-325 MG PO TABS: 1.0000 | ORAL_TABLET | ORAL | 0 refills | Status: DC | PRN

## 2016-11-06 MED ORDER — IBUPROFEN 600 MG PO TABS: 600.0000 mg | ORAL_TABLET | Freq: Four times a day (QID) | ORAL | 0 refills | Status: DC | PRN

## 2016-11-06 NOTE — Discharge Instructions (Signed)
Continue the knee sleeve, continue icing your knee for 20 minutes at a time. 600 mg ibuprofen with 1 g of Tylenol 3-4 times a day as needed for pain. or take the Norco. Do not take both Tylenol and Norco as we discussed. Follow up with Dr. Eulah Pont in 5-7 days for comprehensive evaluation. Start using a cane.

## 2016-11-06 NOTE — ED Provider Notes (Signed)
HPI  SUBJECTIVE:  Adam Oconnell is a 51 y.o. male who presents with right knee pain, swelling after jumping off a riding lawnmower yesterday. She stated plan his foot, heard a pop followed immediately by sharp, constant medial knee pain. He is not sure exactly how he bent his knee. Reports limitation of motion secondary to pain. He tried ice, Advil 400 mg, and keeping the knee straight, and a knee sleeve without much improvement in his symptoms. Symptoms are worse with moving his knee from side-to-side and placing torque on it. No immediate swelling, redness, weakness, bruising, distal numbness or tingling. No injury to the ankle or hip. He is not any antiplatelets or anticoagulants, no bleeding diathesis. No history of diabetes, hypertension, kidney disease, history of right knee injury. BTD:[id number], Elberta Fortis, MD   Past Medical History:  Diagnosis Date  . Allergy   . Asthma   . Ulcer     History reviewed. No pertinent surgical history.  Family History  Problem Relation Age of Onset  . Cancer Mother 66       uterus  . Heart disease Mother 8       CAD-MI  . Heart disease Maternal Uncle     Social History  Substance Use Topics  . Smoking status: Never Smoker  . Smokeless tobacco: Never Used  . Alcohol use No    No current facility-administered medications for this encounter.   Current Outpatient Prescriptions:  .  HYDROcodone-acetaminophen (NORCO/VICODIN) 5-325 MG tablet, Take 1-2 tablets by mouth every 4 (four) hours as needed for moderate pain., Disp: 20 tablet, Rfl: 0 .  ibuprofen (ADVIL,MOTRIN) 600 MG tablet, Take 1 tablet (600 mg total) by mouth every 6 (six) hours as needed., Disp: 30 tablet, Rfl: 0  No Known Allergies   ROS  As noted in HPI.   Physical Exam  BP (!) 134/95 (BP Location: Left Arm)   Pulse 94   Temp 98 F (36.7 C) (Oral)   Resp 18   Ht 5\' 8"  (1.727 m)   Wt 182 lb (82.6 kg)   SpO2 98%   BMI 27.67 kg/m   Constitutional: Well developed,  well nourished, no acute distress Eyes:  EOMI, conjunctiva normal bilaterally HENT: Normocephalic, atraumatic,mucus membranes moist Respiratory: Normal inspiratory effort Cardiovascular: Normal rate GI: nondistended skin: No rash, skin intact Musculoskeletal: R Knee ROM baseline for PT, Flexion and extension intact , Patella NT, Patellar apprehension test negative, Patellar tendon NT, Medial joint  tender, Lateral joint NT, Popliteal region NT, posterior horn of the meniscus bilaterally nontender, Lachman's stable, Varus LCL stress testing stable, Valgus MCL stres/ abnormal, distal NVI with intact baseline sensation / motor  distal to knee. Moderate effusion. No erythema. No increased temperature.  Neurologic: Alert & oriented x 3, no focal neuro deficits Psychiatric: Speech and behavior appropriate   ED Course   Medications - No data to display  Orders Placed This Encounter  Procedures  . DG Knee Complete 4 Views Right    Standing Status:   Standing    Number of Occurrences:   1    Order Specific Question:   Reason for Exam (SYMPTOM  OR DIAGNOSIS REQUIRED)    Answer:   pain/injury    No results found for this or any previous visit (from the past 24 hour(s)). Dg Knee Complete 4 Views Right  Result Date: 11/06/2016 CLINICAL DATA:  Right knee pain after injury. EXAM: RIGHT KNEE - COMPLETE 4+ VIEW COMPARISON:  None. FINDINGS: No acute fracture  or malalignment. Moderate joint effusion. Tiny marginal patellofemoral osteophytes. Joint spaces are preserved. Bone mineralization is normal. Soft tissues are unremarkable. IMPRESSION: Moderate joint effusion.  No acute osseous abnormality. Electronically Signed   By: Obie Dredge M.D.   On: 11/06/2016 11:52    ED Clinical Impression  Injury of right knee, initial encounter  Effusion of right knee   ED Assessment/Plan  [place] Narcotic database reviewed for this patient, and feel that the risk/benefit ratio today is favorable for proceeding  with a prescription for controlled substance. No opiate prescription in the past 6 months.  Reviewed imaging independently. Moderate joint effusion. No fracture, dislocation. See radiology report for details.  Patient with a traumatic effusion. Suspect MCL injury based on exam but he may have a partial tear of his anterior cruciate ligament or PCL with the effusion. Patient's knee is stable. Continue knee sleeve, starting ibuprofen 600 mg of 1 g of Tylenol 3-4 times a day as needed for pain, Norco for severe pain. Ice, rest, advised patient to start using a cane. Will have patient follow-up with Dr. Eulah Pont, orthopedic surgeon on call, in [place] as he lives in [place]. To the ER if he gets worse. Discussed imaging, MDM, plan and followup with patient. Discussed sn/sx that should prompt return to the ED. Patient agrees with plan.   Meds ordered this encounter  Medications  . HYDROcodone-acetaminophen (NORCO/VICODIN) 5-325 MG tablet    Sig: Take 1-2 tablets by mouth every 4 (four) hours as needed for moderate pain.    Dispense:  20 tablet    Refill:  0  . ibuprofen (ADVIL,MOTRIN) 600 MG tablet    Sig: Take 1 tablet (600 mg total) by mouth every 6 (six) hours as needed.    Dispense:  30 tablet    Refill:  0    *This clinic note was created using Scientist, clinical (histocompatibility and immunogenetics). Therefore, there may be occasional mistakes despite careful proofreading.  ?   Domenick Gong, MD 11/06/16 [phone]

## 2016-11-06 NOTE — ED Triage Notes (Signed)
Pt reports he jumped off his riding lawnmower yesterday and thinks he twisted his right knee. Reports minimal swelling and no bruising, but dd hear a pop. Limping gait in triage. No pain at rest but increases significantly when walking

## 2016-12-09 HISTORY — PX: KNEE ARTHROSCOPY: SHX127

## 2017-06-10 ENCOUNTER — Encounter: Payer: Self-pay | Admitting: Family Medicine

## 2017-06-10 ENCOUNTER — Telehealth: Payer: Self-pay | Admitting: Family Medicine

## 2017-06-10 ENCOUNTER — Ambulatory Visit (INDEPENDENT_AMBULATORY_CARE_PROVIDER_SITE_OTHER): Payer: 59 | Admitting: Family Medicine

## 2017-06-10 VITALS — BP 120/80 | HR 78 | Temp 97.9°F | Ht 68.5 in | Wt 181.1 lb

## 2017-06-10 DIAGNOSIS — Z0000 Encounter for general adult medical examination without abnormal findings: Secondary | ICD-10-CM | POA: Diagnosis not present

## 2017-06-10 LAB — TSH: TSH: 1.44 u[IU]/mL (ref 0.35–4.50)

## 2017-06-10 LAB — LIPID PANEL
Cholesterol: 204 mg/dL — ABNORMAL HIGH (ref 0–200)
HDL: 42.6 mg/dL (ref >39.00)
LDL CALC: 125 mg/dL — AB (ref 0–99)
NONHDL: 161.47
Total CHOL/HDL Ratio: 5
Triglycerides: 184 mg/dL — ABNORMAL HIGH (ref 0.0–149.0)
VLDL: 36.8 mg/dL (ref 0.0–40.0)

## 2017-06-10 LAB — HEPATIC FUNCTION PANEL
ALK PHOS: 49 U/L (ref 39–117)
ALT: 19 U/L (ref 0–53)
AST: 17 U/L (ref 0–37)
Albumin: 4.1 g/dL (ref 3.5–5.2)
BILIRUBIN TOTAL: 0.5 mg/dL (ref 0.2–1.2)
Bilirubin, Direct: 0.1 mg/dL (ref 0.0–0.3)
Total Protein: 7.1 g/dL (ref 6.0–8.3)

## 2017-06-10 LAB — CBC WITH DIFFERENTIAL/PLATELET
Basophils Absolute: 0 10*3/uL (ref 0.0–0.1)
Basophils Relative: 0.6 % (ref 0.0–3.0)
EOS ABS: 0.2 10*3/uL (ref 0.0–0.7)
Eosinophils Relative: 3.6 % (ref 0.0–5.0)
HCT: 45.2 % (ref 39.0–52.0)
HEMOGLOBIN: 15.2 g/dL (ref 13.0–17.0)
Lymphocytes Relative: 32.8 % (ref 12.0–46.0)
Lymphs Abs: 2.2 10*3/uL (ref 0.7–4.0)
MCHC: 33.5 g/dL (ref 30.0–36.0)
MCV: 87.1 fl (ref 78.0–100.0)
MONO ABS: 0.4 10*3/uL (ref 0.1–1.0)
Monocytes Relative: 6.5 % (ref 3.0–12.0)
Neutro Abs: 3.7 10*3/uL (ref 1.4–7.7)
Neutrophils Relative %: 56.5 % (ref 43.0–77.0)
Platelets: 300 10*3/uL (ref 150.0–400.0)
RBC: 5.19 Mil/uL (ref 4.22–5.81)
RDW: 13.8 % (ref 11.5–15.5)
WBC: 6.6 10*3/uL (ref 4.0–10.5)

## 2017-06-10 LAB — BASIC METABOLIC PANEL
BUN: 19 mg/dL (ref 6–23)
CO2: 28 mEq/L (ref 19–32)
Calcium: 9.2 mg/dL (ref 8.4–10.5)
Chloride: 102 mEq/L (ref 96–112)
Creatinine, Ser: 0.96 mg/dL (ref 0.40–1.50)
GFR: 86.97 mL/min (ref >60.00)
Glucose, Bld: 94 mg/dL (ref 70–99)
POTASSIUM: 4.8 meq/L (ref 3.5–5.1)
Sodium: 138 mEq/L (ref 135–145)

## 2017-06-10 LAB — PSA: PSA: 1.5 ng/mL (ref 0.10–4.00)

## 2017-06-10 NOTE — Patient Instructions (Addendum)
Confirm date of last colonoscopy.  We have you on a waiting list for the new shingles vaccine.     Fat and Cholesterol Restricted Diet High levels of fat and cholesterol in your blood may lead to various health problems, such as diseases of the heart, blood vessels, gallbladder, liver, and pancreas. Fats are concentrated sources of energy that come in various forms. Certain types of fat, including saturated fat, may be harmful in excess. Cholesterol is a substance needed by your body in small amounts. Your body makes all the cholesterol it needs. Excess cholesterol comes from the food you eat. When you have high levels of cholesterol and saturated fat in your blood, health problems can develop because the excess fat and cholesterol will gather along the walls of your blood vessels, causing them to narrow. Choosing the right foods will help you control your intake of fat and cholesterol. This will help keep the levels of these substances in your blood within normal limits and reduce your risk of disease. What is my plan? Your health care provider recommends that you:  Limit your fat intake to ______% or less of your total calories per day.  Limit the amount of cholesterol in your diet to less than _________mg per day.  Eat 20-30 grams of fiber each day.  What types of fat should I choose?  Choose healthy fats more often. Choose monounsaturated and polyunsaturated fats, such as olive and canola oil, flaxseeds, walnuts, almonds, and seeds.  Eat more omega-3 fats. Good choices include salmon, mackerel, sardines, tuna, flaxseed oil, and ground flaxseeds. Aim to eat fish at least two times a week.  Limit saturated fats. Saturated fats are primarily found in animal products, such as meats, butter, and cream. Plant sources of saturated fats include palm oil, palm kernel oil, and coconut oil.  Avoid foods with partially hydrogenated oils in them. These contain trans fats. Examples of foods that  contain trans fats are stick margarine, some tub margarines, cookies, crackers, and other baked goods. What general guidelines do I need to follow? These guidelines for healthy eating will help you control your intake of fat and cholesterol:  Check food labels carefully to identify foods with trans fats or high amounts of saturated fat.  Fill one half of your plate with vegetables and green salads.  Fill one fourth of your plate with whole grains. Look for the word "whole" as the first word in the ingredient list.  Fill one fourth of your plate with lean protein foods.  Limit fruit to two servings a day. Choose fruit instead of juice.  Eat more foods that contain fiber, such as apples, broccoli, carrots, beans, peas, and barley.  Eat more home-cooked food and less restaurant, buffet, and fast food.  Limit or avoid alcohol.  Limit foods high in starch and sugar.  Limit fried foods.  Cook foods using methods other than frying. Baking, boiling, grilling, and broiling are all great options.  Lose weight if you are overweight. Losing just 5-10% of your initial body weight can help your overall health and prevent diseases such as diabetes and heart disease.  What foods can I eat? Grains  Whole grains, such as whole wheat or whole grain breads, crackers, cereals, and pasta. Unsweetened oatmeal, bulgur, barley, quinoa, or brown rice. Corn or whole wheat flour tortillas. Vegetables  Fresh or frozen vegetables (raw, steamed, roasted, or grilled). Green salads. Fruits  All fresh, canned (in natural juice), or frozen fruits. Meats and other protein  foods  Ground beef (85% or leaner), grass-fed beef, or beef trimmed of fat. Skinless chicken or [place]. Ground chicken or [place]. Pork trimmed of fat. All fish and seafood. Eggs. Dried beans, peas, or lentils. Unsalted nuts or seeds. Unsalted canned or dry beans. Dairy  Low-fat dairy products, such as skim or 1% milk, 2% or reduced-fat  cheeses, low-fat ricotta or cottage cheese, or plain low-fat yo Fats and oils  Tub margarines without trans fats. Light or reduced-fat mayonnaise and salad dressings. Avocado. Olive, canola, sesame, or safflower oils. Natural peanut or almond butter (choose ones without added sugar and oil). The items listed above may not be a complete list of recommended foods or beverages. Contact your dietitian for more options. Foods to avoid Grains  White bread. White pasta. White rice. Cornbread. Bagels, pastries, and croissants. Crackers that contain trans fat. Vegetables  White potatoes. Corn. Creamed or fried vegetables. Vegetables in a cheese sauce. Fruits  Dried fruits. Canned fruit in light or heavy syrup. Fruit juice. Meats and other protein foods  Fatty cuts of meat. Ribs, chicken wings, bacon, sausage, bologna, salami, chitterlings, fatback, hot dogs, bratwurst, and packaged luncheon meats. Liver and organ meats. Dairy  Whole or 2% milk, cream, half-and-half, and cream cheese. Whole milk cheeses. Whole-fat or sweetened yogurt. Full-fat cheeses. Nondairy creamers and whipped toppings. Processed cheese, cheese spreads, or cheese curds. Beverages  Alcohol. Sweetened drinks (such as sodas, lemonade, and fruit drinks or punches). Fats and oils  Butter, stick margarine, lard, shortening, ghee, or bacon fat. Coconut, palm kernel, or palm oils. Sweets and desserts  Corn syrup, sugars, honey, and molasses. Candy. Jam and jelly. Syrup. Sweetened cereals. Cookies, pies, cakes, donuts, muffins, and ice cream. The items listed above may not be a complete list of foods and beverages to avoid. Contact your dietitian for more information. This information is not intended to replace advice given to you by your health care provider. Make sure you discuss any questions you have with your health care provider. Document Released: 02/25/2005 Document Revised: 03/18/2014 Document Reviewed:  05/26/2013 Elsevier Interactive Patient Education  [employer].

## 2017-06-10 NOTE — Telephone Encounter (Signed)
Copied from CRM [phone]. Topic: Quick Communication - Lab Results >> Jun 10, 2017  2:21 PM Trenton GammonVereen, Rachel B, CMA wrote: Called patient to inform them of  lab results. When patient returns call, triage nurse may disclose results.      Patient calling back about lab results

## 2017-06-10 NOTE — Telephone Encounter (Signed)
Charted in result notes. 

## 2017-06-10 NOTE — Progress Notes (Addendum)
Subjective:     Patient ID: Adam Oconnell, male   DOB: 1965/09/21, 52 y.o.   MRN: [id number]  HPI Patient seen for physical exam. Generally very healthy. Takes no regular medications. He's had history of mild hyperlipidemia. His mother had coronary disease in her 2470s. He also has uncle has coronary disease in his late 2560s.  Patient did have right knee surgery in October following injury and has recovered fairly well since then. He had previous colonoscopy sometime in his 440s and is not sure if he is due for repeat this time. He does have interest in new shingles vaccine. Tetanus is up-to-date.  Past Medical History:  Diagnosis Date  . Allergy   . Asthma   . Ulcer    No past surgical history on file.  reports that he has never smoked. He has never used smokeless tobacco. He reports that he does not drink alcohol or use drugs. family history includes Cancer (age of onset: 8060) in his mother; Heart disease in his maternal uncle; Heart disease (age of onset: 1874) in his mother. No Known Allergies   The 10-year ASCVD risk score Denman George(Goff DC Jr., et al., 2013) is: 5%   Values used to calculate the score:     Age: [age] years     Sex: Male     Is Non-Hispanic African American: No     Diabetic: No     Tobacco smoker: No     Systolic Blood Pressure: 120 mmHg     Is BP treated: No     HDL Cholesterol: 42.6 mg/dL     Total Cholesterol: 204 mg/dL    Review of Systems  Constitutional: Negative for activity change, appetite change, fatigue and fever.  HENT: Negative for congestion, ear pain and trouble swallowing.   Eyes: Negative for pain and visual disturbance.  Respiratory: Negative for cough, shortness of breath and wheezing.   Cardiovascular: Negative for chest pain and palpitations.  Gastrointestinal: Negative for abdominal distention, abdominal pain, blood in stool, constipation, diarrhea, nausea, rectal pain and vomiting.  Genitourinary: Negative for dysuria, hematuria and testicular pain.   Musculoskeletal: Negative for arthralgias and joint swelling.  Skin: Negative for rash.  Neurological: Negative for dizziness, syncope and headaches.  Hematological: Negative for adenopathy.  Psychiatric/Behavioral: Negative for confusion and dysphoric mood.       Objective:   Physical Exam  Constitutional: He is oriented to person, place, and time. He appears well-developed and well-nourished. No distress.  HENT:  Head: Normocephalic and atraumatic.  Right Ear: External ear normal.  Left Ear: External ear normal.  Mouth/Throat: Oropharynx is clear and moist.  Eyes: Pupils are equal, round, and reactive to light. Conjunctivae and EOM are normal.  Neck: Normal range of motion. Neck supple. No thyromegaly present.  Cardiovascular: Normal rate, regular rhythm and normal heart sounds.  No murmur heard. Pulmonary/Chest: No respiratory distress. He has no wheezes. He has no rales.  Abdominal: Soft. Bowel sounds are normal. He exhibits no distension and no mass. There is no tenderness. There is no rebound and no guarding.  Musculoskeletal: He exhibits no edema.  Lymphadenopathy:    He has no cervical adenopathy.  Neurological: He is alert and oriented to person, place, and time. He displays normal reflexes. No cranial nerve deficit.  Skin: No rash noted.  No concerning skin lesions  Psychiatric: He has a normal mood and affect.       Assessment:     Physical exam. The following issues were  addressed    Plan:     -He will check with [facility] to confirm date of last colonoscopy and be back in touch -We discussed new shingles vaccine and he is placed on waiting list -Obtain screening lab work. Consider statin if 10 year risk of CAD is over 7.5%  Kristian Covey MD [facility]

## 2017-06-15 ENCOUNTER — Emergency Department (HOSPITAL_COMMUNITY): Payer: 59

## 2017-06-15 ENCOUNTER — Encounter (HOSPITAL_COMMUNITY): Payer: Self-pay

## 2017-06-15 ENCOUNTER — Ambulatory Visit (HOSPITAL_COMMUNITY)
Admission: EM | Admit: 2017-06-15 | Discharge: 2017-06-15 | Disposition: A | Discharge status: Home / Self Care | Payer: 59 | Source: Home / Self Care

## 2017-06-15 ENCOUNTER — Inpatient Hospital Stay (HOSPITAL_COMMUNITY)
Admission: EM | Admit: 2017-06-15 | Discharge: 2017-06-17 | DRG: 392 | Disposition: A | Discharge status: Home / Self Care | Payer: 59 | Attending: Internal Medicine | Admitting: Internal Medicine

## 2017-06-15 ENCOUNTER — Other Ambulatory Visit: Payer: Self-pay

## 2017-06-15 DIAGNOSIS — R1032 Left lower quadrant pain: Secondary | ICD-10-CM | POA: Diagnosis not present

## 2017-06-15 DIAGNOSIS — E785 Hyperlipidemia, unspecified: Secondary | ICD-10-CM | POA: Diagnosis present

## 2017-06-15 DIAGNOSIS — J4520 Mild intermittent asthma, uncomplicated: Secondary | ICD-10-CM | POA: Diagnosis present

## 2017-06-15 DIAGNOSIS — K5720 Diverticulitis of large intestine with perforation and abscess without bleeding: Secondary | ICD-10-CM | POA: Diagnosis present

## 2017-06-15 DIAGNOSIS — K5792 Diverticulitis of intestine, part unspecified, without perforation or abscess without bleeding: Secondary | ICD-10-CM

## 2017-06-15 DIAGNOSIS — Z7951 Long term (current) use of inhaled steroids: Secondary | ICD-10-CM | POA: Diagnosis not present

## 2017-06-15 DIAGNOSIS — K5732 Diverticulitis of large intestine without perforation or abscess without bleeding: Secondary | ICD-10-CM

## 2017-06-15 HISTORY — DX: Diverticulitis of intestine, part unspecified, without perforation or abscess without bleeding: K57.92

## 2017-06-15 HISTORY — DX: Pneumonia, unspecified organism: J18.9

## 2017-06-15 HISTORY — DX: Other seasonal allergic rhinitis: J30.2

## 2017-06-15 HISTORY — DX: Gastric ulcer, unspecified as acute or chronic, without hemorrhage or perforation: K25.9

## 2017-06-15 LAB — COMPREHENSIVE METABOLIC PANEL
ALBUMIN: 3.8 g/dL (ref 3.5–5.0)
ALT: 18 U/L (ref 17–63)
ANION GAP: 10 (ref 5–15)
AST: 22 U/L (ref 15–41)
Alkaline Phosphatase: 58 U/L (ref 38–126)
BILIRUBIN TOTAL: 0.5 mg/dL (ref 0.3–1.2)
BUN: 15 mg/dL (ref 6–20)
CO2: 21 mmol/L — AB (ref 22–32)
Calcium: 8.5 mg/dL — ABNORMAL LOW (ref 8.9–10.3)
Chloride: 106 mmol/L (ref 101–111)
Creatinine, Ser: 1.04 mg/dL (ref 0.61–1.24)
GFR calc Af Amer: >60 mL/min (ref >60)
GFR calc non Af Amer: >60 mL/min (ref >60)
GLUCOSE: 94 mg/dL (ref 65–99)
Potassium: 4 mmol/L (ref 3.5–5.1)
SODIUM: 137 mmol/L (ref 135–145)
TOTAL PROTEIN: 7 g/dL (ref 6.5–8.1)

## 2017-06-15 LAB — URINALYSIS, ROUTINE W REFLEX MICROSCOPIC
BACTERIA UA: NONE SEEN
Bilirubin Urine: NEGATIVE
Glucose, UA: NEGATIVE mg/dL
Ketones, ur: NEGATIVE mg/dL
Leukocytes, UA: NEGATIVE
Nitrite: NEGATIVE
PROTEIN: NEGATIVE mg/dL
SPECIFIC GRAVITY, URINE: 1.024 (ref 1.005–1.030)
SQUAMOUS EPITHELIAL / LPF: NONE SEEN
pH: 5 (ref 5.0–8.0)

## 2017-06-15 LAB — CBC WITH DIFFERENTIAL/PLATELET
Basophils Absolute: 0 10*3/uL (ref 0.0–0.1)
Basophils Relative: 0 %
EOS PCT: 3 %
Eosinophils Absolute: 0.3 10*3/uL (ref 0.0–0.7)
HCT: 43.8 % (ref 39.0–52.0)
HEMOGLOBIN: 14.4 g/dL (ref 13.0–17.0)
LYMPHS PCT: 28 %
Lymphs Abs: 3 10*3/uL (ref 0.7–4.0)
MCH: 28.6 pg (ref 26.0–34.0)
MCHC: 32.9 g/dL (ref 30.0–36.0)
MCV: 87.1 fL (ref 78.0–100.0)
Monocytes Absolute: 0.9 10*3/uL (ref 0.1–1.0)
Monocytes Relative: 8 %
NEUTROS PCT: 61 %
Neutro Abs: 6.7 10*3/uL (ref 1.7–7.7)
PLATELETS: 215 10*3/uL (ref 150–400)
RBC: 5.03 MIL/uL (ref 4.22–5.81)
RDW: 13.6 % (ref 11.5–15.5)
WBC: 10.8 10*3/uL — AB (ref 4.0–10.5)

## 2017-06-15 LAB — LIPASE, BLOOD: Lipase: 43 U/L (ref 11–51)

## 2017-06-15 MED ORDER — ENOXAPARIN SODIUM 40 MG/0.4ML ~~LOC~~ SOLN
40.0000 mg | SUBCUTANEOUS | Status: DC
  Administered 2017-06-15 – 2017-06-16 (×2): 40 mg via SUBCUTANEOUS
  Filled 2017-06-15 (×2): qty 0.4

## 2017-06-15 MED ORDER — KETOROLAC TROMETHAMINE 15 MG/ML IJ SOLN
15.0000 mg | Freq: Four times a day (QID) | INTRAMUSCULAR | Status: DC | PRN
  Administered 2017-06-16: 15 mg via INTRAVENOUS
  Filled 2017-06-15 (×2): qty 1

## 2017-06-15 MED ORDER — SODIUM CHLORIDE 0.9 % IV BOLUS
1000.0000 mL | Freq: Once | INTRAVENOUS | Status: AC
  Administered 2017-06-15: 1000 mL via INTRAVENOUS

## 2017-06-15 MED ORDER — ONDANSETRON HCL 4 MG/2ML IJ SOLN
4.0000 mg | Freq: Once | INTRAMUSCULAR | Status: AC
  Administered 2017-06-15: 4 mg via INTRAVENOUS
  Filled 2017-06-15: qty 2

## 2017-06-15 MED ORDER — MORPHINE SULFATE (PF) 4 MG/ML IV SOLN
2.0000 mg | INTRAVENOUS | Status: DC | PRN
  Administered 2017-06-16 (×2): 2 mg via INTRAVENOUS
  Filled 2017-06-15 (×3): qty 1

## 2017-06-15 MED ORDER — IOPAMIDOL (ISOVUE-300) INJECTION 61%
100.0000 mL | Freq: Once | INTRAVENOUS | Status: AC | PRN
  Administered 2017-06-15: 100 mL via INTRAVENOUS

## 2017-06-15 MED ORDER — IOPAMIDOL (ISOVUE-300) INJECTION 61%
INTRAVENOUS | Status: AC
  Filled 2017-06-15: qty 100

## 2017-06-15 MED ORDER — CIPROFLOXACIN IN D5W 400 MG/200ML IV SOLN
400.0000 mg | Freq: Two times a day (BID) | INTRAVENOUS | Status: DC
  Administered 2017-06-16 – 2017-06-17 (×4): 400 mg via INTRAVENOUS
  Filled 2017-06-15 (×4): qty 200

## 2017-06-15 MED ORDER — METRONIDAZOLE IN NACL 5-0.79 MG/ML-% IV SOLN
500.0000 mg | Freq: Three times a day (TID) | INTRAVENOUS | Status: DC
  Administered 2017-06-15 – 2017-06-17 (×5): 500 mg via INTRAVENOUS
  Filled 2017-06-15 (×6): qty 100

## 2017-06-15 MED ORDER — ONDANSETRON HCL 4 MG PO TABS: 4.0000 mg | ORAL_TABLET | Freq: Four times a day (QID) | ORAL | Status: DC | PRN

## 2017-06-15 MED ORDER — MORPHINE SULFATE (PF) 4 MG/ML IV SOLN
4.0000 mg | Freq: Once | INTRAVENOUS | Status: AC
  Administered 2017-06-15: 4 mg via INTRAVENOUS
  Filled 2017-06-15: qty 1

## 2017-06-15 MED ORDER — ONDANSETRON HCL 4 MG/2ML IJ SOLN: 4.0000 mg | Freq: Four times a day (QID) | INTRAMUSCULAR | Status: DC | PRN

## 2017-06-15 MED ORDER — DEXTROSE-NACL 5-0.9 % IV SOLN
INTRAVENOUS | Status: DC
  Administered 2017-06-15 – 2017-06-17 (×4): via INTRAVENOUS

## 2017-06-15 MED ORDER — PIPERACILLIN-TAZOBACTAM 3.375 G IVPB 30 MIN
3.3750 g | Freq: Once | INTRAVENOUS | Status: AC
  Administered 2017-06-15: 3.375 g via INTRAVENOUS
  Filled 2017-06-15: qty 50

## 2017-06-15 NOTE — ED Notes (Signed)
ED Provider at bedside. 

## 2017-06-15 NOTE — ED Provider Notes (Signed)
[facility]  SURGICAL Provider Note   CSN: [id number] Arrival date & time: 06/15/17  1652     History   Chief Complaint Chief Complaint  Patient presents with  . Abdominal Pain    HPI Adam Oconnell is a 52 y.o. male.  HPI   Adam Oconnell is a 52 y.o. male, with a history of asthma, presenting to the ED with abdominal pain beginning 4 days ago.  Pain is sharp, waxing and waning, ranges from 2/10-10/10, nonradiating, has been increasing in frequency and intensity.  States, "last night the pain brought me to my knees."  When pain intensifies, patient states he does not want to move because movement makes the pain worse.  Accompanied by diarrhea.  Denies fever/chills, nausea/vomiting, hematochezia/melena, urinary complaints, testicular pain or swelling, or any other complaints.    Past Medical History:  Diagnosis Date  . Allergy   . Asthma   . Ulcer     Patient Active Problem List   Diagnosis Date Noted  . Colonic diverticular abscess 06/15/2017  . Dyslipidemia 04/25/2014  . Asthma, mild intermittent 07/21/2012  . History of peptic ulcer 07/21/2012    History reviewed. No pertinent surgical history.      Home Medications    Prior to Admission medications   Medication Sig Start Date End Date Taking? Authorizing Provider  albuterol (PROVENTIL HFA;VENTOLIN HFA) 108 (90 Base) MCG/ACT inhaler Inhale 2 puffs into the lungs every 6 (six) hours as needed for wheezing or shortness of breath.   Yes [provider]  ibuprofen (ADVIL,MOTRIN) 200 MG tablet Take 400 mg by mouth every 6 (six) hours as needed for headache (pain).   Yes [provider]    Family History Family History  Problem Relation Age of Onset  . Cancer Mother 29       uterus  . Heart disease Mother 49       CAD-MI  . Heart disease Maternal Uncle     Social History Social History   Tobacco Use  . Smoking status: Never Smoker  . Smokeless tobacco:  Never Used  Substance Use Topics  . Alcohol use: No  . Drug use: No     Allergies   Patient has no known allergies.   Review of Systems Review of Systems  Constitutional: Negative for chills and fever.  Respiratory: Negative for shortness of breath.   Cardiovascular: Negative for chest pain.  Gastrointestinal: Positive for abdominal pain and diarrhea. Negative for blood in stool, nausea and vomiting.  Genitourinary: Negative for difficulty urinating, discharge, dysuria, frequency, hematuria, penile pain, scrotal swelling and testicular pain.  Musculoskeletal: Negative for back pain.  All other systems reviewed and are negative.    Physical Exam Updated Vital Signs BP (!) 143/101 (BP Location: Right Arm)   Pulse 85   Temp 98 F (36.7 C) (Oral)   Resp 18   SpO2 98%   Physical Exam  Constitutional: He appears well-developed and well-nourished. No distress.  HENT:  Head: Normocephalic and atraumatic.  Eyes: Conjunctivae are normal.  Neck: Neck supple.  Cardiovascular: Normal rate, regular rhythm, normal heart sounds and intact distal pulses.  Pulmonary/Chest: Effort normal and breath sounds normal. No respiratory distress.  Abdominal: Soft. There is tenderness in the left lower quadrant. There is no guarding.  Musculoskeletal: He exhibits no edema.  Lymphadenopathy:    He has no cervical adenopathy.  Neurological: He is alert.  Skin: Skin is warm and dry. He is  not diaphoretic.  Psychiatric: He has a normal mood and affect. His behavior is normal.  Nursing note and vitals reviewed.    ED Treatments / Results  Labs (all labs ordered are listed, but only abnormal results are displayed) Labs Reviewed  COMPREHENSIVE METABOLIC PANEL - Abnormal; Notable for the following components:      Result Value   CO2 21 (*)    Calcium 8.5 (*)    All other components within normal limits  URINALYSIS, ROUTINE W REFLEX MICROSCOPIC - Abnormal; Notable for the following components:     Hgb urine dipstick SMALL (*)    All other components within normal limits  CBC WITH DIFFERENTIAL/PLATELET - Abnormal; Notable for the following components:   WBC 10.8 (*)    All other components within normal limits  LIPASE, BLOOD  HIV ANTIBODY (ROUTINE TESTING)  CBC  CREATININE, SERUM  COMPREHENSIVE METABOLIC PANEL  CBC    EKG None  Radiology Ct Abdomen Pelvis W Contrast  Result Date: 06/15/2017 CLINICAL DATA:  Left lower quadrant pain EXAM: CT ABDOMEN AND PELVIS WITH CONTRAST TECHNIQUE: Multidetector CT imaging of the abdomen and pelvis was performed using the standard protocol following bolus administration of intravenous contrast. CONTRAST:  100mL ISOVUE-300 IOPAMIDOL (ISOVUE-300) INJECTION 61% COMPARISON:  None. FINDINGS: Lower chest: Unremarkable Hepatobiliary: No focal abnormality within the liver parenchyma. There is no evidence for gallstones, gallbladder wall thickening, or pericholecystic fluid. No intrahepatic or extrahepatic biliary dilation. Pancreas: No focal mass lesion. No dilatation of the main duct. No intraparenchymal cyst. No peripancreatic edema. Spleen: No splenomegaly. No focal mass lesion. Adrenals/Urinary Tract: No adrenal nodule or mass. 3 mm nonobstructing stone identified in the upper pole of the right kidney. Left kidney unremarkable. No evidence for hydroureter. The urinary bladder appears normal for the degree of distention. Stomach/Bowel: Stomach is nondistended. No gastric wall thickening. No evidence of outlet obstruction. Duodenum is normally positioned as is the ligament of Treitz. No small bowel wall thickening. No small bowel dilatation. The terminal ileum is normal. The appendix is normal. Diverticular changes are noted in the left colon. There is a segment of wall thickening in the proximal sigmoid segment with pericolonic edema/inflammation and a small amount of fluid in the root of the sigmoid mesocolon. Imaging features compatible with acute  diverticulitis. No evidence for perforation. Sagittal imaging demonstrates a tiny 1.4 cm rim enhancing collection adjacent to this area of abnormal colon, compatible with a tiny abscess. Vascular/Lymphatic: No abdominal aortic aneurysm. There is no gastrohepatic or hepatoduodenal ligament lymphadenopathy. No intraperitoneal or retroperitoneal lymphadenopathy. No pelvic sidewall lymphadenopathy. Reproductive: Prostate gland upper normal for size. Other: No intraperitoneal free fluid. Musculoskeletal: Bone windows reveal no worrisome lytic or sclerotic osseous lesions. IMPRESSION: 1. Left colonic diverticulosis with a short segment of sigmoid colon demonstrating wall thickening and pericolonic edema/inflammation. Imaging features consistent with acute diverticulitis. No evidence for perforation or percutaneously drainable abscess although there is a tiny 14 mm abscess in the fat adjacent to the inflamed colon. 2. Nonobstructing 3 mm right renal stone. Electronically Signed   By: Kennith CenterEric  Mansell M.D.   On: 06/15/2017 19:39    Procedures Procedures (including critical care time)  Medications Ordered in ED Medications  iopamidol (ISOVUE-300) 61 % injection (has no administration in time range)  enoxaparin (LOVENOX) injection 40 mg (has no administration in time range)  dextrose 5 %-0.9 % sodium chloride infusion (has no administration in time range)  ketorolac (TORADOL) 15 MG/ML injection 15 mg (has no administration in time range)  ondansetron (ZOFRAN) tablet 4 mg (has no administration in time range)    Or  ondansetron (ZOFRAN) injection 4 mg (has no administration in time range)  ciprofloxacin (CIPRO) IVPB 400 mg (has no administration in time range)  metroNIDAZOLE (FLAGYL) IVPB 500 mg (has no administration in time range)  morphine 4 MG/ML injection 2 mg (has no administration in time range)  iopamidol (ISOVUE-300) 61 % injection 100 mL (100 mLs Intravenous Contrast Given 06/15/17 1909)  morphine 4  MG/ML injection 4 mg (4 mg Intravenous Given 06/15/17 1948)  ondansetron (ZOFRAN) injection 4 mg (4 mg Intravenous Given 06/15/17 1948)  piperacillin-tazobactam (ZOSYN) IVPB 3.375 g (0 g Intravenous Stopped 06/15/17 2110)  sodium chloride 0.9 % bolus 1,000 mL (1,000 mLs Intravenous Transfusing/Transfer 06/15/17 2139)     Initial Impression / Assessment and Plan / ED Course  I have reviewed the triage vital signs and the nursing notes.  Pertinent labs & imaging results that were available during my care of the patient were reviewed by me and considered in my medical decision making (see chart for details).  Clinical Course as of Jun 16 2155  Wynelle Link Jun 15, 2017  2014 Spoke with Dr. Mikeal Hawthorne, hospitalist. Agrees to admit the patient. Requests general surgery involvement.    [SJ]  2102 Spoke with Dr. Lindie Spruce, on call for general surgery. States patient is likely not a surgical candidate.    [SJ]    Clinical Course User Index [SJ] Merriel Zinger C, PA-C    Patient presents with lower left quadrant abdominal pain.   Patient is nontoxic appearing, afebrile, not tachycardic, not tachypneic, and not hypotensive. Mild leukocytosis.  Patient is quite tender on exam. Diverticulitis with evidence of possible small abscess on CT.  Admission with IV antibiotics.   Findings and plan of care discussed with Drema Pry, MD. Dr. Eudelia Bunch personally evaluated and examined this patient.  Vitals:   06/15/17 2030 06/15/17 2045 06/15/17 2115 06/15/17 2150  BP: (!) 132/99 132/90 125/90 (!) 146/95  Pulse: 92 81 79 79  Resp:    18  Temp:    98.2 F (36.8 C)  TempSrc:    Oral  SpO2: 98% 99% 99% 100%     Final Clinical Impressions(s) / ED Diagnoses   Final diagnoses:  Diverticulitis of colon    ED Discharge Orders    None       Anselm Pancoast, PA-C 06/15/17 2202

## 2017-06-15 NOTE — Discharge Instructions (Addendum)
I think you may have diverticulitis.  I can not rule out a perforation at this time and I think you need a CT scan.  Please go to the ED for further care.

## 2017-06-15 NOTE — ED Provider Notes (Signed)
Medical screening examination/treatment/procedure(s) were conducted as a shared visit with non-physician practitioner(s) and myself.  I personally evaluated the patient during the encounter. Briefly, the patient is a 52 y.o. male here with LLQ abd pain and found to have diverticulitis with small abscess. Give IV ABx and admitted to medicine. Will discuss case with EGS. .    EKG Interpretation None           Adam Oconnell, Amadeo GarnetPedro Eduardo, MD 06/15/17 2036

## 2017-06-15 NOTE — ED Notes (Signed)
Dr Wyatt at bedside.  

## 2017-06-15 NOTE — ED Triage Notes (Signed)
Left Lower abd pain since Thursday, per pt he is going out of town for business next week and he just want to make sure he's okay

## 2017-06-15 NOTE — ED Provider Notes (Signed)
Patient placed in Quick Look pathway, seen and evaluated   Chief Complaint: Abdominal pain  HPI:   Patient complains of left lower quadrant abdominal pain intermittent for the last 4 days, increasing in frequency and intensity.  Pain is sharp, waxes and wanes, ranges from 2/10 to 10/10, nonradiating.  Accompanied by diarrhea.  Denies fever, nausea, vomiting, hematochezia/melena, urinary complaints, testicular pain.  ROS: Abdominal pain (one)  Physical Exam:   Gen: No distress  Neuro: Awake and Alert  Skin: Warm    Focused Exam:   No diaphoresis.  No pallor.  Pulmonary: No increased work of breathing.  Speaks in full sentences without difficulty.  No tachypnea.  Cardiac: Normal rate and regular.   Abdominal: Tenderness in the left lower quadrant.  No tenderness in the suprapubic or inguinal region.  No peritoneal signs.  No rebound tenderness.  No guarding.  No CVA tenderness.   Initiation of care has begun. The patient has been counseled on the process, plan, and necessity for staying for the completion/evaluation, and the remainder of the medical screening examination.   Anselm PancoastJoy, Saylee Sherrill C, PA-C 06/15/17 1737    Tilden Fossaees, Elizabeth, MD 06/16/17 1202

## 2017-06-15 NOTE — ED Triage Notes (Signed)
Pt coming in from urgent care. Has been having abdominal pain that started last night that was excruciating. Pt only reports a small amount of diarrhea. Pain is left side in nature.

## 2017-06-15 NOTE — Consult Note (Signed)
Reason for Consult:Acute diverticulitis Referring Physician: Dr. [name] is an 52 y.o. male.  HPI: Pain since Thursday, has worsened and went to urgent care then here.  Past Medical History:  Diagnosis Date  . Allergy   . Asthma   . Ulcer     History reviewed. No pertinent surgical history.  Family History  Problem Relation Age of Onset  . Cancer Mother 44       uterus  . Heart disease Mother 45       CAD-MI  . Heart disease Maternal Uncle     Social History:  reports that he has never smoked. He has never used smokeless tobacco. He reports that he does not drink alcohol or use drugs.  Allergies: No Known Allergies  Medications: I have reviewed the patient's current medications.  Results for orders placed or performed during the hospital encounter of 06/15/17 (from the past 48 hour(s))  CBC with Differential     Status: Abnormal   Collection Time: 06/15/17  5:27 PM  Result Value Ref Range   WBC 10.8 (H) 4.0 - 10.5 K/uL   RBC 5.03 4.22 - 5.81 MIL/uL   Hemoglobin 14.4 13.0 - 17.0 g/dL   HCT 43.8 39.0 - 52.0 %   MCV 87.1 78.0 - 100.0 fL   MCH 28.6 26.0 - 34.0 pg   MCHC 32.9 30.0 - 36.0 g/dL   RDW 13.6 11.5 - 15.5 %   Platelets 215 150 - 400 K/uL   Neutrophils Relative % 61 %   Neutro Abs 6.7 1.7 - 7.7 K/uL   Lymphocytes Relative 28 %   Lymphs Abs 3.0 0.7 - 4.0 K/uL   Monocytes Relative 8 %   Monocytes Absolute 0.9 0.1 - 1.0 K/uL   Eosinophils Relative 3 %   Eosinophils Absolute 0.3 0.0 - 0.7 K/uL   Basophils Relative 0 %   Basophils Absolute 0.0 0.0 - 0.1 K/uL    Comment: Performed at [facility], [address] [place], [place] [address]  Lipase, blood     Status: None   Collection Time: 06/15/17  5:30 PM  Result Value Ref Range   Lipase 43 11 - 51 U/L    Comment: Performed at [facility] [address], [place], [place] [address]  Comprehensive metabolic panel     Status: Abnormal   Collection Time: 06/15/17  5:30 PM  Result  Value Ref Range   Sodium 137 135 - 145 mmol/L   Potassium 4.0 3.5 - 5.1 mmol/L   Chloride 106 101 - 111 mmol/L   CO2 21 (L) 22 - 32 mmol/L   Glucose, Bld 94 65 - 99 mg/dL   BUN 15 6 - 20 mg/dL   Creatinine, Ser 1.04 0.61 - 1.24 mg/dL   Calcium 8.5 (L) 8.9 - 10.3 mg/dL   Total Protein 7.0 6.5 - 8.1 g/dL   Albumin 3.8 3.5 - 5.0 g/dL   AST 22 15 - 41 U/L   ALT 18 17 - 63 U/L   Alkaline Phosphatase 58 38 - 126 U/L   Total Bilirubin 0.5 0.3 - 1.2 mg/dL   GFR calc non Af Amer >60 >60 mL/min   GFR calc Af Amer >60 >60 mL/min    Comment: (NOTE) The eGFR has been calculated using the CKD EPI equation. This calculation has not been validated in all clinical situations. eGFR's persistently <60 mL/min signify possible Chronic Kidney Disease.    Anion gap 10 5 -  15    Comment: Performed at [facility], [place] [address], [place], [place] [address]  Urinalysis, Routine w reflex microscopic     Status: Abnormal   Collection Time: 06/15/17  6:09 PM  Result Value Ref Range   Color, Urine YELLOW YELLOW   APPearance CLEAR CLEAR   Specific Gravity, Urine 1.024 1.005 - 1.030   pH 5.0 5.0 - 8.0   Glucose, UA NEGATIVE NEGATIVE mg/dL   Hgb urine dipstick SMALL (A) NEGATIVE   Bilirubin Urine NEGATIVE NEGATIVE   Ketones, ur NEGATIVE NEGATIVE mg/dL   Protein, ur NEGATIVE NEGATIVE mg/dL   Nitrite NEGATIVE NEGATIVE   Leukocytes, UA NEGATIVE NEGATIVE   RBC / HPF 0-5 0 - 5 RBC/hpf   WBC, UA 0-5 0 - 5 WBC/hpf   Bacteria, UA NONE SEEN NONE SEEN   Squamous Epithelial / LPF NONE SEEN NONE SEEN   Mucus PRESENT     Comment: Performed at [facility], [place] [address] [place], [place] [address]    Ct Abdomen Pelvis W Contrast  Result Date: 06/15/2017 CLINICAL DATA:  Left lower quadrant pain EXAM: CT ABDOMEN AND PELVIS WITH CONTRAST TECHNIQUE: Multidetector CT imaging of the abdomen and pelvis was performed using the standard protocol following bolus administration of intravenous contrast.  CONTRAST:  135m ISOVUE-300 IOPAMIDOL (ISOVUE-300) INJECTION 61% COMPARISON:  None. FINDINGS: Lower chest: Unremarkable Hepatobiliary: No focal abnormality within the liver parenchyma. There is no evidence for gallstones, gallbladder wall thickening, or pericholecystic fluid. No intrahepatic or extrahepatic biliary dilation. Pancreas: No focal mass lesion. No dilatation of the main duct. No intraparenchymal cyst. No peripancreatic edema. Spleen: No splenomegaly. No focal mass lesion. Adrenals/Urinary Tract: No adrenal nodule or mass. 3 mm nonobstructing stone identified in the upper pole of the right kidney. Left kidney unremarkable. No evidence for hydroureter. The urinary bladder appears normal for the degree of distention. Stomach/Bowel: Stomach is nondistended. No gastric wall thickening. No evidence of outlet obstruction. Duodenum is normally positioned as is the ligament of Treitz. No small bowel wall thickening. No small bowel dilatation. The terminal ileum is normal. The appendix is normal. Diverticular changes are noted in the left colon. There is a segment of wall thickening in the proximal sigmoid segment with pericolonic edema/inflammation and a small amount of fluid in the root of the sigmoid mesocolon. Imaging features compatible with acute diverticulitis. No evidence for perforation. Sagittal imaging demonstrates a tiny 1.4 cm rim enhancing collection adjacent to this area of abnormal colon, compatible with a tiny abscess. Vascular/Lymphatic: No abdominal aortic aneurysm. There is no gastrohepatic or hepatoduodenal ligament lymphadenopathy. No intraperitoneal or retroperitoneal lymphadenopathy. No pelvic sidewall lymphadenopathy. Reproductive: Prostate gland upper normal for size. Other: No intraperitoneal free fluid. Musculoskeletal: Bone windows reveal no worrisome lytic or sclerotic osseous lesions. IMPRESSION: 1. Left colonic diverticulosis with a short segment of sigmoid colon demonstrating wall  thickening and pericolonic edema/inflammation. Imaging features consistent with acute diverticulitis. No evidence for perforation or percutaneously drainable abscess although there is a tiny 14 mm abscess in the fat adjacent to the inflamed colon. 2. Nonobstructing 3 mm right renal stone. Electronically Signed   By: EMisty StanleyM.D.   On: 06/15/2017 19:39    Review of Systems  Constitutional: Negative for chills and fever.  Gastrointestinal: Positive for abdominal pain. Negative for diarrhea, nausea and vomiting.  All other systems reviewed and are negative.  Blood pressure (!) 132/99, pulse 92, temperature 98.3 F (36.8 C), temperature source Oral, resp. rate 18,  SpO2 98 %. Physical Exam  Nursing note and vitals reviewed. Constitutional: He is oriented to person, place, and time. He appears well-developed and well-nourished.  HENT:  Head: Normocephalic and atraumatic.  Eyes: Pupils are equal, round, and reactive to light. Conjunctivae and EOM are normal.  Neck: Normal range of motion. Neck supple.  Cardiovascular: Normal rate, regular rhythm and normal heart sounds.  Respiratory: Effort normal and breath sounds normal.  GI: Soft. Normal appearance and bowel sounds are normal. There is tenderness in the left lower quadrant. There is no rigidity, no rebound and no guarding.  Musculoskeletal: Normal range of motion.  Neurological: He is alert and oriented to person, place, and time. He has normal reflexes.  Skin: Skin is warm and dry.  Psychiatric: He has a normal mood and affect. His behavior is normal. Judgment and thought content normal.    Assessment/Plan: Acute diverticulitis with possible small associated abscess  IV antibiotics Does not require percutaneous drainage. May have clear liquids for now. No suppositories or enemas Not likely to require surgical intervention  Adam Oconnell 06/15/2017, 9:17 PM

## 2017-06-15 NOTE — ED Provider Notes (Signed)
06/15/2017 4:47 PM   DOB: Sep 29, 1965 / MRN: [id number]  SUBJECTIVE:  Adam Oconnell is a 52 y.o. male presenting for left-sided lower abdominal pain that has been present now for 4 days and is worsening.  Patient denies constipation he had a colonoscopy however this was over 10 years ago as records are not available for review.  He has been eating somewhat less since this pain began.  He denies dysuria, urgency, frequency.  He has No Known Allergies.   He  has a past medical history of Allergy, Asthma, and Ulcer.    He  reports that he has never smoked. He has never used smokeless tobacco. He reports that he does not drink alcohol or use drugs. He  has no sexual activity history on file. The patient  has no past surgical history on file.  His family history includes Cancer (age of onset: 4360) in his mother; Heart disease in his maternal uncle; Heart disease (age of onset: 2674) in his mother.  Review of Systems  Constitutional: Negative for chills, diaphoresis and fever.  Eyes: Negative.   Respiratory: Negative for shortness of breath.   Cardiovascular: Negative for chest pain, orthopnea and leg swelling.  Gastrointestinal: Positive for abdominal pain. Negative for blood in stool, constipation, diarrhea, heartburn, melena, nausea and vomiting.  Skin: Negative for rash.  Neurological: Negative for dizziness, sensory change, speech change, focal weakness and headaches.    OBJECTIVE:  BP 135/81 (BP Location: Left Arm)   Pulse (!) 101   Temp 98.8 F (37.1 C) (Oral)   SpO2 99%   Physical Exam  Constitutional: He appears well-developed. He is active and cooperative.  Non-toxic appearance.  Cardiovascular: Normal rate, regular rhythm, S1 normal, S2 normal, normal heart sounds, intact distal pulses and normal pulses. Exam reveals no gallop and no friction rub.  No murmur heard. Pulmonary/Chest: Effort normal. No stridor. No tachypnea. No respiratory distress. He has no wheezes. He has no  rales.  Abdominal: Bowel sounds are normal. He exhibits no distension and no mass. There is tenderness in the epigastric area and suprapubic area. There is guarding. There is no CVA tenderness.  Genitourinary:     Musculoskeletal: He exhibits no edema.  Neurological: He is alert.  Skin: Skin is warm and dry. He is not diaphoretic. No pallor.  Vitals reviewed.   No results found for this or any previous visit (from the past 72 hour(s)).  No results found.  ASSESSMENT AND PLAN:  No orders of the defined types were placed in this encounter.    Abdominal pain, left lower quadrant: I have advised that he presented to the ED given he likely needs a CT scan as I cannot rule out a perforation at this time.  Patient noted to have a left scrotal mass most consistent with a cystocele.  Most likely unrelated to the left lower abdominal quadrant pain.  Advised to follow-up with his primary care provider regarding this.    The patient is advised to call or return to clinic if he does not see an improvement in symptoms, or to seek the care of the closest emergency department if he worsens with the above plan.   Adam Oconnell, MHS, PA-C 06/15/2017 4:47 PM    Adam Oconnell, Adam L, PA-C 06/15/17 1650

## 2017-06-15 NOTE — H&P (Signed)
History and Physical    Adam Oconnell JWJ:[id number] DOB: 01-01-1966 DOA: 06/15/2017  Referring MD/NP/PA: Harolyn Rutherford, PA  PCP: Kristian Covey, MD   Outpatient Specialists: None  Patient coming from: Home  Chief Complaint: Abdominal pain  HPI: Adam Oconnell is a 52 y.o. male with medical history significant of  No significant medical history presenting with abdominal pain, for 4 days. Pain is at 6/10 now and has gone from 2/10 to 10/10 sometimes. No NVD. Has never had this problem before. Patient had mild Diarrhea. It is exacerbated by meals and not relieved by anything.Patient did not take any medications. He came to the ER where he was found to have diverticulitis.  ED Course: white count is 10.8 thousand with normal hemoglobin. Normal chemistry CT abdomen and pelvis showed 1. Left colonic diverticulosis with a short segment of sigmoid colon demonstrating wall thickening and pericolonic edema/inflammation. Imaging features consistent with acute diverticulitis. No evidence for perforation or percutaneously drainable abscess although there is a tiny 14 mm abscess in the fat adjacent to the inflamed colon. 2. Nonobstructing 3 mm right renal stone. IV Zosyn was initiated.  Review of Systems: As per HPI otherwise 10 point review of systems negative.    Past Medical History:  Diagnosis Date  . Allergy   . Asthma   . Ulcer     History reviewed. No pertinent surgical history.   reports that he has never smoked. He has never used smokeless tobacco. He reports that he does not drink alcohol or use drugs.  No Known Allergies  Family History  Problem Relation Age of Onset  . Cancer Mother 51       uterus  . Heart disease Mother 74       CAD-MI  . Heart disease Maternal Uncle     Prior to Admission medications   Not on File    Physical Exam: Vitals:   06/15/17 1704 06/15/17 2027  BP: (!) 143/101   Pulse: 85   Resp: 18   Temp: 98 F (36.7 C) 98.3 F (36.8 C)    TempSrc: Oral Oral  SpO2: 98%       Constitutional: NAD, calm, comfortable Vitals:   06/15/17 1704 06/15/17 2027  BP: (!) 143/101   Pulse: 85   Resp: 18   Temp: 98 F (36.7 C) 98.3 F (36.8 C)  TempSrc: Oral Oral  SpO2: 98%    Eyes: PERRL, lids and conjunctivae normal ENMT: Mucous membranes are moist. Posterior pharynx clear of any exudate or lesions.Normal dentition.  Neck: normal, supple, no masses, no thyromegaly Respiratory: clear to auscultation bilaterally, no wheezing, no crackles. Normal respiratory effort. No accessory muscle use.  Cardiovascular: Regular rate and rhythm, no murmurs / rubs / gallops. No extremity edema. 2+ pedal pulses. No carotid bruits.  Abdomen: mild tenderness, no masses palpated. No hepatosplenomegaly. Bowel sounds positive.  Musculoskeletal: no clubbing / cyanosis. No joint deformity upper and lower extremities. Good ROM, no contractures. Normal muscle tone.  Skin: no rashes, lesions, ulcers. No induration Neurologic: CN 2-12 grossly intact. Sensation intact, DTR normal. Strength 5/5 in all 4.  Psychiatric: Normal judgment and insight. Alert and oriented x 3. Normal mood.   Labs on Admission: I have personally reviewed following labs and imaging studies  CBC: Recent Labs  Lab 06/10/17 0911 06/15/17 1727  WBC 6.6 10.8*  NEUTROABS 3.7 6.7  HGB 15.2 14.4  HCT 45.2 43.8  MCV 87.1 87.1  PLT 300.0 215   Basic  Metabolic Panel: Recent Labs  Lab 06/10/17 0911 06/15/17 1730  NA 138 137  K 4.8 4.0  CL 102 106  CO2 28 21*  GLUCOSE 94 94  BUN 19 15  CREATININE 0.96 1.04  CALCIUM 9.2 8.5*   GFR: Estimated Creatinine Clearance: 80.9 mL/min (by C-G formula based on SCr of 1.04 mg/dL). Liver Function Tests: Recent Labs  Lab 06/10/17 0911 06/15/17 1730  AST 17 22  ALT 19 18  ALKPHOS 49 58  BILITOT 0.5 0.5  PROT 7.1 7.0  ALBUMIN 4.1 3.8   Recent Labs  Lab 06/15/17 1730  LIPASE 43   No results for input(s): AMMONIA in the last  168 hours. Coagulation Profile: No results for input(s): INR, PROTIME in the last 168 hours. Cardiac Enzymes: No results for input(s): CKTOTAL, CKMB, CKMBINDEX, TROPONINI in the last 168 hours. BNP (last 3 results) No results for input(s): PROBNP in the last 8760 hours. HbA1C: No results for input(s): HGBA1C in the last 72 hours. CBG: No results for input(s): GLUCAP in the last 168 hours. Lipid Profile: No results for input(s): CHOL, HDL, LDLCALC, TRIG, CHOLHDL, LDLDIRECT in the last 72 hours. Thyroid Function Tests: No results for input(s): TSH, T4TOTAL, FREET4, T3FREE, THYROIDAB in the last 72 hours. Anemia Panel: No results for input(s): VITAMINB12, FOLATE, FERRITIN, TIBC, IRON, RETICCTPCT in the last 72 hours. Urine analysis:    Component Value Date/Time   COLORURINE YELLOW 06/15/2017 1809   APPEARANCEUR CLEAR 06/15/2017 1809   LABSPEC 1.024 06/15/2017 1809   PHURINE 5.0 06/15/2017 1809   GLUCOSEU NEGATIVE 06/15/2017 1809   HGBUR SMALL (A) 06/15/2017 1809   BILIRUBINUR NEGATIVE 06/15/2017 1809   BILIRUBINUR neg 11/29/2013 1127   KETONESUR NEGATIVE 06/15/2017 1809   PROTEINUR NEGATIVE 06/15/2017 1809   UROBILINOGEN 0.2 11/29/2013 1127   NITRITE NEGATIVE 06/15/2017 1809   LEUKOCYTESUR NEGATIVE 06/15/2017 1809   Sepsis Labs: @LABRCNTIP (procalcitonin:4,lacticidven:4) )No results found for this or any previous visit (from the past 240 hour(s)).   Radiological Exams on Admission: Ct Abdomen Pelvis W Contrast  Result Date: 06/15/2017 CLINICAL DATA:  Left lower quadrant pain EXAM: CT ABDOMEN AND PELVIS WITH CONTRAST TECHNIQUE: Multidetector CT imaging of the abdomen and pelvis was performed using the standard protocol following bolus administration of intravenous contrast. CONTRAST:  ISOVUE-300 IOPAMIDOL (ISOVUE-300) INJECTION 61% COMPARISON:  None. FINDINGS: Lower chest: Unremarkable Hepatobiliary: No focal abnormality within the liver parenchyma. There is no evidence for  gallstones, gallbladder wall thickening, or pericholecystic fluid. No intrahepatic or extrahepatic biliary dilation. Pancreas: No focal mass lesion. No dilatation of the main duct. No intraparenchymal cyst. No peripancreatic edema. Spleen: No splenomegaly. No focal mass lesion. Adrenals/Urinary Tract: No adrenal nodule or mass. 3 mm nonobstructing stone identified in the upper pole of the right kidney. Left kidney unremarkable. No evidence for hydroureter. The urinary bladder appears normal for the degree of distention. Stomach/Bowel: Stomach is nondistended. No gastric wall thickening. No evidence of outlet obstruction. Duodenum is normally positioned as is the ligament of Treitz. No small bowel wall thickening. No small bowel dilatation. The terminal ileum is normal. The appendix is normal. Diverticular changes are noted in the left colon. There is a segment of wall thickening in the proximal sigmoid segment with pericolonic edema/inflammation and a small amount of fluid in the root of the sigmoid mesocolon. Imaging features compatible with acute diverticulitis. No evidence for perforation. Sagittal imaging demonstrates a tiny 1.4 cm rim enhancing collection adjacent to this area of abnormal colon, compatible with a tiny  abscess. Vascular/Lymphatic: No abdominal aortic aneurysm. There is no gastrohepatic or hepatoduodenal ligament lymphadenopathy. No intraperitoneal or retroperitoneal lymphadenopathy. No pelvic sidewall lymphadenopathy. Reproductive: Prostate gland upper normal for size. Other: No intraperitoneal free fluid. Musculoskeletal: Bone windows reveal no worrisome lytic or sclerotic osseous lesions. IMPRESSION: 1. Left colonic diverticulosis with a short segment of sigmoid colon demonstrating wall thickening and pericolonic edema/inflammation. Imaging features consistent with acute diverticulitis. No evidence for perforation or percutaneously drainable abscess although there is a tiny 14 mm abscess in  the fat adjacent to the inflamed colon. 2. Nonobstructing 3 mm right renal stone. Electronically Signed   By: Kennith CenterEric  Mansell M.D.   On: 06/15/2017 19:39    EKG: Independently reviewed.   Assessment/Plan Principal Problem:   Colonic diverticular abscess Active Problems:   Asthma, mild intermittent   Dyslipidemia    #1 acute diverticulitis with abscess: patient will be admitted for inpatient antibiotics IV. Surgery consulted. Abscess is small and no perforation. May not require any percutaneous drainage or surgical drainage. Pain management on bowel rest. Cipro and flagyl started IV.  #2 leukocytosis: Mild leukocytosis secondary to diverticulitis and abscess. Monitor  #3 history of asthma: Not on any treatment. No exacerbation  #4 hyperlipidemia:Not on any home medications.   DVT prophylaxis: lovenox  Code Status: Full  Family Communication: Daughter at bedside Disposition Plan: Home Consults called: Dr Lindie SpruceWyatt, Gen Surgery  Admission status: inpatient, Medsurg   Severity of Illness: The appropriate patient status for this patient is INPATIENT. Inpatient status is judged to be reasonable and necessary in order to provide the required intensity of service to ensure the patient's safety. The patient's presenting symptoms, physical exam findings, and initial radiographic and laboratory data in the context of their chronic comorbidities is felt to place them at high risk for further clinical deterioration. Furthermore, it is not anticipated that the patient will be medically stable for discharge from the hospital within 2 midnights of admission. The following factors support the patient status of inpatient.   " The patient's presenting symptoms include abdominal pain. " The worrisome physical exam findings include abdominal tenderness. " The initial radiographic and laboratory data are worrisome because of diverticulitis with abscess. " The chronic co-morbidities include none.   * I  certify that at the point of admission it is my clinical judgment that the patient will require inpatient hospital care spanning beyond 2 midnights from the point of admission due to high intensity of service, high risk for further deterioration and high frequency of surveillance required.Lonia Blood*    GARBA,LAWAL MD [facility] Pager [phone]  If 7PM-7AM, please contact night-coverage [email or web address] Password [email or web address]  06/15/2017, 8:29 PM

## 2017-06-16 ENCOUNTER — Encounter (HOSPITAL_COMMUNITY): Payer: Self-pay | Admitting: General Practice

## 2017-06-16 ENCOUNTER — Other Ambulatory Visit: Payer: Self-pay

## 2017-06-16 DIAGNOSIS — K5720 Diverticulitis of large intestine with perforation and abscess without bleeding: Principal | ICD-10-CM

## 2017-06-16 LAB — COMPREHENSIVE METABOLIC PANEL
ALT: 17 U/L (ref 17–63)
ANION GAP: 9 (ref 5–15)
AST: 18 U/L (ref 15–41)
Albumin: 3.1 g/dL — ABNORMAL LOW (ref 3.5–5.0)
Alkaline Phosphatase: 42 U/L (ref 38–126)
BILIRUBIN TOTAL: 0.7 mg/dL (ref 0.3–1.2)
BUN: 10 mg/dL (ref 6–20)
CO2: 25 mmol/L (ref 22–32)
Calcium: 8.1 mg/dL — ABNORMAL LOW (ref 8.9–10.3)
Chloride: 105 mmol/L (ref 101–111)
Creatinine, Ser: 1.14 mg/dL (ref 0.61–1.24)
Glucose, Bld: 108 mg/dL — ABNORMAL HIGH (ref 65–99)
POTASSIUM: 4.3 mmol/L (ref 3.5–5.1)
Sodium: 139 mmol/L (ref 135–145)
TOTAL PROTEIN: 5.8 g/dL — AB (ref 6.5–8.1)

## 2017-06-16 LAB — CBC
HEMATOCRIT: 39.7 % (ref 39.0–52.0)
HEMOGLOBIN: 12.9 g/dL — AB (ref 13.0–17.0)
MCH: 28.8 pg (ref 26.0–34.0)
MCHC: 32.5 g/dL (ref 30.0–36.0)
MCV: 88.6 fL (ref 78.0–100.0)
Platelets: 189 10*3/uL (ref 150–400)
RBC: 4.48 MIL/uL (ref 4.22–5.81)
RDW: 14.1 % (ref 11.5–15.5)
WBC: 7.5 10*3/uL (ref 4.0–10.5)

## 2017-06-16 LAB — HIV ANTIBODY (ROUTINE TESTING W REFLEX): HIV SCREEN 4TH GENERATION: NONREACTIVE

## 2017-06-16 NOTE — Progress Notes (Signed)
Patient ID: WESS BANEY, male   DOB: 07-28-1965, 52 y.o.   MRN: [id number]                                                                PROGRESS NOTE                                                                                                                                                                                                             Patient Demographics:    Adam Oconnell, is a 52 y.o. male, DOB - 12/15/1965, ONG:[id number]  Admit date - 06/15/2017   Admitting Physician Rometta Emery, MD  Outpatient Primary MD for the patient is Kristian Covey, MD  LOS - 1  Outpatient Specialists:     Chief Complaint  Patient presents with  . Abdominal Pain       Brief Narrative     52 y.o. male with medical history significant of  No significant medical history presenting with abdominal pain, for 4 days. Pain is at 6/10 now and has gone from 2/10 to 10/10 sometimes. No NVD. Has never had this problem before. Patient had mild Diarrhea. It is exacerbated by meals and not relieved by anything.Patient did not take any medications. He came to the ER where he was found to have diverticulitis.  ED Course: white count is 10.8 thousand with normal hemoglobin. Normal chemistry CT abdomen and pelvis showed 1. Left colonic diverticulosis with a short segment of sigmoid colon demonstrating wall thickening and pericolonic edema/inflammation. Imaging features consistent with acute diverticulitis. No evidence for perforation or percutaneously drainable abscess although there is a tiny 14 mm abscess in the fat adjacent to the inflamed colon. 2. Nonobstructing 3 mm right renal stone. IV Zosyn was initiated.      Subjective:    Adam Oconnell today states that his abdominal pain is better.  Minimal in the LLQ.  Denies fever, chills, n/v, diarrhea, brbpr.    No headache, No chest pain, , No new weakness tingling or numbness, No Cough - SOB.   Assessment  & Plan :    Principal  Problem:   Colonic diverticular abscess Active Problems:   Asthma, mild intermittent   Dyslipidemia     #1 acute diverticulitis with abscess: patient will be admitted for inpatient  antibiotics IV. Surgery consulted. Abscess is small and no perforation. May not require any percutaneous drainage or surgical drainage. Pain management on bowel rest.  Cont Cipro and flagyl iv Advance diet as per surgery , appreciate input  #2 leukocytosis: Mild leukocytosis secondary to diverticulitis and abscess. Monitor  #3 history of asthma: Not on any treatment. No exacerbation  #4 hyperlipidemia:Not on any home medications.       Code Status : FULLCODE  Family Communication  : w patient  Disposition Plan  :   home  Barriers For Discharge :   Consults  :  surgery  Procedures  :     DVT Prophylaxis  :  Lovenox -- SCDs   Lab Results  Component Value Date   PLT 215 06/15/2017    Antibiotics  : cipro, flagyl 4/7=>  Anti-infectives (From admission, onward)   Start     Dose/Rate Route Frequency Ordered Stop   06/15/17 2200  ciprofloxacin (CIPRO) IVPB 400 mg     400 mg 200 mL/hr over 60 Minutes Intravenous Every 12 hours 06/15/17 2143     06/15/17 2200  metroNIDAZOLE (FLAGYL) IVPB 500 mg     500 mg 100 mL/hr over 60 Minutes Intravenous Every 8 hours 06/15/17 2143     06/15/17 2015  piperacillin-tazobactam (ZOSYN) IVPB 3.375 g     3.375 g 100 mL/hr over 30 Minutes Intravenous  Once 06/15/17 2007 06/15/17 2110        Objective:   Vitals:   06/15/17 2045 06/15/17 2115 06/15/17 2150 06/16/17 0506  BP: 132/90 125/90 (!) 146/95 110/85  Pulse: 81 79 79 78  Resp:   18 18  Temp:   98.2 F (36.8 C) 98.5 F (36.9 C)  TempSrc:   Oral Oral  SpO2: 99% 99% 100% 97%    Wt Readings from Last 3 Encounters:  06/10/17 82.1 kg (181 lb 1.6 oz)  11/06/16 82.6 kg (182 lb)  10/28/16 82.8 kg (182 lb 9.6 oz)     Intake/Output Summary (Last 24 hours) at 06/16/2017 0715 Last data filed  at 06/16/2017 0309 Gross per 24 hour  Intake 856.25 ml  Output -  Net 856.25 ml     Physical Exam  Awake Alert, Oriented X 3, No new F.N deficits, Normal affect [place].AT,PERRAL Supple Neck,No JVD, No cervical lymphadenopathy appriciated.  Symmetrical Chest wall movement, Good air movement bilaterally, CTAB RRR,No Gallops,Rubs or new Murmurs, No Parasternal Heave +ve B.Sounds, Abd Soft, No tenderness, No organomegaly appriciated, No rebound - guarding or rigidity. No Cyanosis, Clubbing or edema, No new Rash or bruise     Data Review:    CBC Recent Labs  Lab 06/10/17 0911 06/15/17 1727  WBC 6.6 10.8*  HGB 15.2 14.4  HCT 45.2 43.8  PLT 300.0 215  MCV 87.1 87.1  MCH  --  28.6  MCHC 33.5 32.9  RDW 13.8 13.6  LYMPHSABS 2.2 3.0  MONOABS 0.4 0.9  EOSABS 0.2 0.3  BASOSABS 0.0 0.0    Chemistries  Recent Labs  Lab 06/10/17 0911 06/15/17 1730  NA 138 137  K 4.8 4.0  CL 102 106  CO2 28 21*  GLUCOSE 94 94  BUN 19 15  CREATININE 0.96 1.04  CALCIUM 9.2 8.5*  AST 17 22  ALT 19 18  ALKPHOS 49 58  BILITOT 0.5 0.5   ------------------------------------------------------------------------------------------------------------------ No results for input(s): CHOL, HDL, LDLCALC, TRIG, CHOLHDL, LDLDIRECT in the last 72 hours.  No results found for: HGBA1C ------------------------------------------------------------------------------------------------------------------ No results for  input(s): TSH, T4TOTAL, T3FREE, THYROIDAB in the last 72 hours.  Invalid input(s): FREET3 ------------------------------------------------------------------------------------------------------------------ No results for input(s): VITAMINB12, FOLATE, FERRITIN, TIBC, IRON, RETICCTPCT in the last 72 hours.  Coagulation profile No results for input(s): INR, PROTIME in the last 168 hours.  No results for input(s): DDIMER in the last 72 hours.  Cardiac Enzymes No results for input(s): CKMB,  TROPONINI, MYOGLOBIN in the last 168 hours.  Invalid input(s): CK ------------------------------------------------------------------------------------------------------------------ No results found for: BNP  Inpatient Medications  Scheduled Meds: . enoxaparin (LOVENOX) injection  40 mg Subcutaneous Q24H   Continuous Infusions: . ciprofloxacin Stopped (06/16/17 0103)  . dextrose 5 % and 0.9% NaCl 125 mL/hr at 06/15/17 2242  . metronidazole Stopped (06/15/17 2341)   PRN Meds:.ketorolac, morphine injection, ondansetron **OR** ondansetron (ZOFRAN) IV  Micro Results No results found for this or any previous visit (from the past 240 hour(s)).  Radiology Reports Ct Abdomen Pelvis W Contrast  Result Date: 06/15/2017 CLINICAL DATA:  Left lower quadrant pain EXAM: CT ABDOMEN AND PELVIS WITH CONTRAST TECHNIQUE: Multidetector CT imaging of the abdomen and pelvis was performed using the standard protocol following bolus administration of intravenous contrast. CONTRAST:  100mL ISOVUE-300 IOPAMIDOL (ISOVUE-300) INJECTION 61% COMPARISON:  None. FINDINGS: Lower chest: Unremarkable Hepatobiliary: No focal abnormality within the liver parenchyma. There is no evidence for gallstones, gallbladder wall thickening, or pericholecystic fluid. No intrahepatic or extrahepatic biliary dilation. Pancreas: No focal mass lesion. No dilatation of the main duct. No intraparenchymal cyst. No peripancreatic edema. Spleen: No splenomegaly. No focal mass lesion. Adrenals/Urinary Tract: No adrenal nodule or mass. 3 mm nonobstructing stone identified in the upper pole of the right kidney. Left kidney unremarkable. No evidence for hydroureter. The urinary bladder appears normal for the degree of distention. Stomach/Bowel: Stomach is nondistended. No gastric wall thickening. No evidence of outlet obstruction. Duodenum is normally positioned as is the ligament of Treitz. No small bowel wall thickening. No small bowel dilatation. The  terminal ileum is normal. The appendix is normal. Diverticular changes are noted in the left colon. There is a segment of wall thickening in the proximal sigmoid segment with pericolonic edema/inflammation and a small amount of fluid in the root of the sigmoid mesocolon. Imaging features compatible with acute diverticulitis. No evidence for perforation. Sagittal imaging demonstrates a tiny 1.4 cm rim enhancing collection adjacent to this area of abnormal colon, compatible with a tiny abscess. Vascular/Lymphatic: No abdominal aortic aneurysm. There is no gastrohepatic or hepatoduodenal ligament lymphadenopathy. No intraperitoneal or retroperitoneal lymphadenopathy. No pelvic sidewall lymphadenopathy. Reproductive: Prostate gland upper normal for size. Other: No intraperitoneal free fluid. Musculoskeletal: Bone windows reveal no worrisome lytic or sclerotic osseous lesions. IMPRESSION: 1. Left colonic diverticulosis with a short segment of sigmoid colon demonstrating wall thickening and pericolonic edema/inflammation. Imaging features consistent with acute diverticulitis. No evidence for perforation or percutaneously drainable abscess although there is a tiny 14 mm abscess in the fat adjacent to the inflamed colon. 2. Nonobstructing 3 mm right renal stone. Electronically Signed   By: Kennith CenterEric  Mansell M.D.   On: 06/15/2017 19:39    Time Spent in minutes  30   Pearson GrippeJames Devonte Migues M.D on 06/16/2017 at 7:15 AM  Between 7am to 7pm - Pager - [phone]    After 7pm go to [email or web address] - password [email or web address]  [facility] -  Office  [phone]

## 2017-06-16 NOTE — Progress Notes (Addendum)
[facility] Progress Note     Subjective: CC:  Rates pain as 0-1 this morning compared to 4/10 yesterday. Worse with movement. Last BM yesterday 2PM denies melena, hematochezia. Urinating without issue. Denies fever, chills, nausea, emesis.  pts GI physician is Dr. Matthias HughsBuccini   Objective: Vital signs in last 24 hours: Temp:  [98 F (36.7 C)-98.8 F (37.1 C)] 98.5 F (36.9 C) (04/08 0506) Pulse Rate:  [78-101] 78 (04/08 0506) Resp:  [18] 18 (04/08 0506) BP: (110-146)/(81-101) 110/85 (04/08 0506) SpO2:  [97 %-100 %] 97 % (04/08 0506) Last BM Date: 06/15/17  Intake/Output from previous day: 04/07 0701 - 04/08 0700 In: 856.3 [I.V.:556.3; IV Piggyback:300] Out: -  Intake/Output this shift: No intake/output data recorded.  PE: Gen:  Alert, NAD, pleasant Card:  Regular rate and rhythm, pedal pulses 2+ BL Pulm:  Normal effort, clear to auscultation bilaterally Abd: Soft, TTP LLQ suprapubic region, no peritonitis, +BS Skin: warm and dry, no rashes  Psych: A&Ox3   Lab Results:  Recent Labs    06/15/17 1727  WBC 10.8*  HGB 14.4  HCT 43.8  PLT 215   BMET Recent Labs    06/15/17 1730  NA 137  K 4.0  CL 106  CO2 21*  GLUCOSE 94  BUN 15  CREATININE 1.04  CALCIUM 8.5*   PT/INR No results for input(s): LABPROT, INR in the last 72 hours. CMP     Component Value Date/Time   NA 137 06/15/2017 1730   K 4.0 06/15/2017 1730   CL 106 06/15/2017 1730   CO2 21 (L) 06/15/2017 1730   GLUCOSE 94 06/15/2017 1730   BUN 15 06/15/2017 1730   CREATININE 1.04 06/15/2017 1730   CALCIUM 8.5 (L) 06/15/2017 1730   PROT 7.0 06/15/2017 1730   ALBUMIN 3.8 06/15/2017 1730   AST 22 06/15/2017 1730   ALT 18 06/15/2017 1730   ALKPHOS 58 06/15/2017 1730   BILITOT 0.5 06/15/2017 1730   GFRNONAA >60 06/15/2017 1730   GFRAA >60 06/15/2017 1730   Lipase     Component Value Date/Time   LIPASE 43 06/15/2017 1730       Studies/Results: Ct Abdomen Pelvis W  Contrast  Result Date: 06/15/2017 CLINICAL DATA:  Left lower quadrant pain EXAM: CT ABDOMEN AND PELVIS WITH CONTRAST TECHNIQUE: Multidetector CT imaging of the abdomen and pelvis was performed using the standard protocol following bolus administration of intravenous contrast. CONTRAST:  100mL ISOVUE-300 IOPAMIDOL (ISOVUE-300) INJECTION 61% COMPARISON:  None. FINDINGS: Lower chest: Unremarkable Hepatobiliary: No focal abnormality within the liver parenchyma. There is no evidence for gallstones, gallbladder wall thickening, or pericholecystic fluid. No intrahepatic or extrahepatic biliary dilation. Pancreas: No focal mass lesion. No dilatation of the main duct. No intraparenchymal cyst. No peripancreatic edema. Spleen: No splenomegaly. No focal mass lesion. Adrenals/Urinary Tract: No adrenal nodule or mass. 3 mm nonobstructing stone identified in the upper pole of the right kidney. Left kidney unremarkable. No evidence for hydroureter. The urinary bladder appears normal for the degree of distention. Stomach/Bowel: Stomach is nondistended. No gastric wall thickening. No evidence of outlet obstruction. Duodenum is normally positioned as is the ligament of Treitz. No small bowel wall thickening. No small bowel dilatation. The terminal ileum is normal. The appendix is normal. Diverticular changes are noted in the left colon. There is a segment of wall thickening in the proximal sigmoid segment with pericolonic edema/inflammation and a small amount of fluid in the root of the sigmoid mesocolon. Imaging features compatible with acute  diverticulitis. No evidence for perforation. Sagittal imaging demonstrates a tiny 1.4 cm rim enhancing collection adjacent to this area of abnormal colon, compatible with a tiny abscess. Vascular/Lymphatic: No abdominal aortic aneurysm. There is no gastrohepatic or hepatoduodenal ligament lymphadenopathy. No intraperitoneal or retroperitoneal lymphadenopathy. No pelvic sidewall  lymphadenopathy. Reproductive: Prostate gland upper normal for size. Other: No intraperitoneal free fluid. Musculoskeletal: Bone windows reveal no worrisome lytic or sclerotic osseous lesions. IMPRESSION: 1. Left colonic diverticulosis with a short segment of sigmoid colon demonstrating wall thickening and pericolonic edema/inflammation. Imaging features consistent with acute diverticulitis. No evidence for perforation or percutaneously drainable abscess although there is a tiny 14 mm abscess in the fat adjacent to the inflamed colon. 2. Nonobstructing 3 mm right renal stone. Electronically Signed   By: Kennith Center M.D.   On: 06/15/2017 19:39    Anti-infectives: Anti-infectives (From admission, onward)   Start     Dose/Rate Route Frequency Ordered Stop   06/15/17 2200  ciprofloxacin (CIPRO) IVPB 400 mg     400 mg 200 mL/hr over 60 Minutes Intravenous Every 12 hours 06/15/17 2143     06/15/17 2200  metroNIDAZOLE (FLAGYL) IVPB 500 mg     500 mg 100 mL/hr over 60 Minutes Intravenous Every 8 hours 06/15/17 2143     06/15/17 2015  piperacillin-tazobactam (ZOSYN) IVPB 3.375 g     3.375 g 100 mL/hr over 30 Minutes Intravenous  Once 06/15/17 2007 06/15/17 2110     Assessment/Plan Acute diverticulitis with 1.4 cm abscess - afebrile, VSS, leukocytosis resolved.  - TTP LLQ/suprapubic region. - continue IV abx - start clear liquid diet today - mobilize   FEN: Clears ID: Cipro/Flagyl 4/7 >>  VTE: SCD's, Lovenox Follow up: no surgical follow up necessary   Dispo: pain improving on IV abx. If no worsening of sxs with clears then could advance diet and plan for discharge home on PO abx tomorrow.    LOS: 1 day    Adam Phenix , [facility] 06/16/2017, 7:51 AM Pager: [phone] Consults: [phone] Mon-Fri 7:00 am-4:30 pm Sat-Sun 7:00 am-11:30 am

## 2017-06-17 LAB — COMPREHENSIVE METABOLIC PANEL
ALT: 18 U/L (ref 17–63)
AST: 20 U/L (ref 15–41)
Albumin: 3 g/dL — ABNORMAL LOW (ref 3.5–5.0)
Alkaline Phosphatase: 46 U/L (ref 38–126)
Anion gap: 7 (ref 5–15)
BILIRUBIN TOTAL: 0.2 mg/dL — AB (ref 0.3–1.2)
BUN: 9 mg/dL (ref 6–20)
CHLORIDE: 107 mmol/L (ref 101–111)
CO2: 25 mmol/L (ref 22–32)
CREATININE: 1.01 mg/dL (ref 0.61–1.24)
Calcium: 8.2 mg/dL — ABNORMAL LOW (ref 8.9–10.3)
GFR calc Af Amer: >60 mL/min (ref >60)
Glucose, Bld: 135 mg/dL — ABNORMAL HIGH (ref 65–99)
Potassium: 3.7 mmol/L (ref 3.5–5.1)
Sodium: 139 mmol/L (ref 135–145)
Total Protein: 5.5 g/dL — ABNORMAL LOW (ref 6.5–8.1)

## 2017-06-17 LAB — CBC
HEMATOCRIT: 40.3 % (ref 39.0–52.0)
Hemoglobin: 12.8 g/dL — ABNORMAL LOW (ref 13.0–17.0)
MCH: 28.1 pg (ref 26.0–34.0)
MCHC: 31.8 g/dL (ref 30.0–36.0)
MCV: 88.6 fL (ref 78.0–100.0)
PLATELETS: 208 10*3/uL (ref 150–400)
RBC: 4.55 MIL/uL (ref 4.22–5.81)
RDW: 13.5 % (ref 11.5–15.5)
WBC: 6.9 10*3/uL (ref 4.0–10.5)

## 2017-06-17 MED ORDER — ONDANSETRON HCL 4 MG PO TABS: 4.0000 mg | ORAL_TABLET | Freq: Four times a day (QID) | ORAL | 0 refills | Status: DC | PRN

## 2017-06-17 MED ORDER — CIPROFLOXACIN HCL 500 MG PO TABS: 500.0000 mg | ORAL_TABLET | Freq: Two times a day (BID) | ORAL | 0 refills | Status: AC

## 2017-06-17 MED ORDER — METRONIDAZOLE 500 MG PO TABS: 500.0000 mg | ORAL_TABLET | Freq: Three times a day (TID) | ORAL | 0 refills | Status: AC

## 2017-06-17 NOTE — Progress Notes (Signed)
[facility] Progress Note     Subjective CC:  States abd pain resolved around 2 PM yesterday. Still tender to palpation but 0-1/10 pain at rest. Urinating and having normal bowel movements. Tolerated liquids without increase in pain, nausea, or emesis.  Objective: Vital signs in last 24 hours: Temp:  [98.1 F (36.7 C)-98.5 F (36.9 C)] 98.3 F (36.8 C) (04/09 0515) Pulse Rate:  [74-89] 74 (04/09 0515) Resp:  [12-18] 18 (04/09 0515) BP: (117-129)/(73-91) 117/73 (04/09 0515) SpO2:  [94 %-97 %] 96 % (04/09 0515) Weight:  [82.1 kg (181 lb)] 82.1 kg (181 lb) (04/08 2147) Last BM Date: 06/16/17  Intake/Output from previous day: 04/08 0701 - 04/09 0700 In: 2060 [P.O.:660; I.V.:1200; IV Piggyback:200] Out: -  Intake/Output this shift: No intake/output data recorded.  PE: Gen:  Alert, NAD, pleasant Card:  Regular rate and rhythm, pedal pulses 2+ BL Pulm:  Normal effort, clear to auscultation bilaterally Abd: Soft, mild TTP LLQ, no peritonitis, +BS Skin: warm and dry, no rashes  Psych: A&Ox3     Lab Results:  Recent Labs    06/16/17 0714 06/17/17 0359  WBC 7.5 6.9  HGB 12.9* 12.8*  HCT 39.7 40.3  PLT 189 208   BMET Recent Labs    06/16/17 0714 06/17/17 0359  NA 139 139  K 4.3 3.7  CL 105 107  CO2 25 25  GLUCOSE 108* 135*  BUN 10 9  CREATININE 1.14 1.01  CALCIUM 8.1* 8.2*   PT/INR No results for input(s): LABPROT, INR in the last 72 hours. CMP     Component Value Date/Time   NA 139 06/17/2017 0359   K 3.7 06/17/2017 0359   CL 107 06/17/2017 0359   CO2 25 06/17/2017 0359   GLUCOSE 135 (H) 06/17/2017 0359   BUN 9 06/17/2017 0359   CREATININE 1.01 06/17/2017 0359   CALCIUM 8.2 (L) 06/17/2017 0359   PROT 5.5 (L) 06/17/2017 0359   ALBUMIN 3.0 (L) 06/17/2017 0359   AST 20 06/17/2017 0359   ALT 18 06/17/2017 0359   ALKPHOS 46 06/17/2017 0359   BILITOT 0.2 (L) 06/17/2017 0359   GFRNONAA >60 06/17/2017 0359   GFRAA >60 06/17/2017 0359    Lipase     Component Value Date/Time   LIPASE 43 06/15/2017 1730       Studies/Results: Ct Abdomen Pelvis W Contrast  Result Date: 06/15/2017 CLINICAL DATA:  Left lower quadrant pain EXAM: CT ABDOMEN AND PELVIS WITH CONTRAST TECHNIQUE: Multidetector CT imaging of the abdomen and pelvis was performed using the standard protocol following bolus administration of intravenous contrast. CONTRAST:  ISOVUE-300 IOPAMIDOL (ISOVUE-300) INJECTION 61% COMPARISON:  None. FINDINGS: Lower chest: Unremarkable Hepatobiliary: No focal abnormality within the liver parenchyma. There is no evidence for gallstones, gallbladder wall thickening, or pericholecystic fluid. No intrahepatic or extrahepatic biliary dilation. Pancreas: No focal mass lesion. No dilatation of the main duct. No intraparenchymal cyst. No peripancreatic edema. Spleen: No splenomegaly. No focal mass lesion. Adrenals/Urinary Tract: No adrenal nodule or mass. 3 mm nonobstructing stone identified in the upper pole of the right kidney. Left kidney unremarkable. No evidence for hydroureter. The urinary bladder appears normal for the degree of distention. Stomach/Bowel: Stomach is nondistended. No gastric wall thickening. No evidence of outlet obstruction. Duodenum is normally positioned as is the ligament of Treitz. No small bowel wall thickening. No small bowel dilatation. The terminal ileum is normal. The appendix is normal. Diverticular changes are noted in the left colon. There is a segment  of wall thickening in the proximal sigmoid segment with pericolonic edema/inflammation and a small amount of fluid in the root of the sigmoid mesocolon. Imaging features compatible with acute diverticulitis. No evidence for perforation. Sagittal imaging demonstrates a tiny 1.4 cm rim enhancing collection adjacent to this area of abnormal colon, compatible with a tiny abscess. Vascular/Lymphatic: No abdominal aortic aneurysm. There is no gastrohepatic or  hepatoduodenal ligament lymphadenopathy. No intraperitoneal or retroperitoneal lymphadenopathy. No pelvic sidewall lymphadenopathy. Reproductive: Prostate gland upper normal for size. Other: No intraperitoneal free fluid. Musculoskeletal: Bone windows reveal no worrisome lytic or sclerotic osseous lesions. IMPRESSION: 1. Left colonic diverticulosis with a short segment of sigmoid colon demonstrating wall thickening and pericolonic edema/inflammation. Imaging features consistent with acute diverticulitis. No evidence for perforation or percutaneously drainable abscess although there is a tiny 14 mm abscess in the fat adjacent to the inflamed colon. 2. Nonobstructing 3 mm right renal stone. Electronically Signed   By: Kennith CenterEric  Mansell M.D.   On: 06/15/2017 19:39    Anti-infectives: Anti-infectives (From admission, onward)   Start     Dose/Rate Route Frequency Ordered Stop   06/15/17 2200  ciprofloxacin (CIPRO) IVPB 400 mg     400 mg 200 mL/hr over 60 Minutes Intravenous Every 12 hours 06/15/17 2143     06/15/17 2200  metroNIDAZOLE (FLAGYL) IVPB 500 mg     500 mg 100 mL/hr over 60 Minutes Intravenous Every 8 hours 06/15/17 2143     06/15/17 2015  piperacillin-tazobactam (ZOSYN) IVPB 3.375 g     3.375 g 100 mL/hr over 30 Minutes Intravenous  Once 06/15/17 2007 06/15/17 2110       Assessment/Plan Acute diverticulitis with 1.4 cm abscess - afebrile, VSS, leukocytosis resolved.  -advance diet as tolerated - mobilize   FEN: REg ID: Cipro/Flagyl 4/7 >>  VTE: SCD's, Lovenox Follow up: no surgical follow up necessary   Dispo: from a surgical perspective, pt stable for discharge home today on PO abx (augmentin or cipro/flagyl)    LOS: 2 days    Adam PhenixElizabeth S Simaan , [facility] 06/17/2017, 8:25 AM Pager: [phone] Consults: [phone] Mon-Fri 7:00 am-4:30 pm Sat-Sun 7:00 am-11:30 am

## 2017-06-17 NOTE — Progress Notes (Signed)
Patient ID: [name], male   DOB: 08-04-1965, 52 y.o.   MRN: [id number]                                                                PROGRESS NOTE                                                                                                                                                                                                             Patient Demographics:    Adam Oconnell, is a 52 y.o. male, DOB - November 21, 1965, WUJ:[id number]  Admit date - 06/15/2017   Admitting Physician Rometta Emery, MD  Outpatient Primary MD for the patient is Kristian Covey, MD  LOS - 2  Outpatient Specialists:     Chief Complaint  Patient presents with  . Abdominal Pain       Brief Narrative      52 y.o.malewith medical history significant ofNo significant medical history presenting with abdominal pain, for 4 days. Pain is at 6/10 now and has gone from 2/10 to 10/10 sometimes. No NVD. Has never had this problem before. Patient had mild Diarrhea. It is exacerbated by meals and not relieved by anything.Patient did not take any medications. He came to the ER where he was found to have diverticulitis.  ED Course:white count is 10.8 thousand with normal hemoglobin. Normal chemistry CT abdomen and pelvis showed1. Left colonic diverticulosis with a shortsegment of sigmoid colon demonstrating wall thickening and pericolonic edema/inflammation. Imaging features consistent with acute diverticulitis. No evidence for perforation or percutaneously drainable abscess although there is a tiny 14 mm abscess in the fat adjacent to the inflamed colon. 2. Nonobstructing 3 mm right renal stone.IV Zosyn was initiated.     Subjective:    Adam Oconnell today still has slight left LQ pain. With palpation per pt.  Denies fever, chills, cp, palp, sob, n/v, diarrhea, brbpr. Advanced to liquid diet yesterday.   No headache,  No new weakness tingling or numbness, No Cough - SOB.    Assessment  & Plan :      Principal Problem:   Colonic diverticular abscess Active Problems:   Asthma, mild intermittent   Dyslipidemia      #1 acute diverticulitis with abscess:patient will be admitted for inpatient antibiotics IV. Surgery consulted.  Abscess is small and no perforation. May not require any percutaneous drainage or surgical drainage. Pain management on bowel rest.  Cont Cipro and flagyl iv Advance diet as per surgery , appreciate input  #2 leukocytosis: Mild leukocytosis secondary to diverticulitis and abscess. Monitor  #3 history of asthma:Not on any treatment. No exacerbation  #4 hyperlipidemia:Not on any home medications.       Code Status : FULLCODE  Family Communication  : w patient  Disposition Plan  :   home  Barriers For Discharge :   Consults  :  surgery  Procedures  :     DVT Prophylaxis  :  Lovenox -- SCDs       Lab Results  Component Value Date   PLT 208 06/17/2017    Abx cipro, flagyl 4/7=>  Anti-infectives (From admission, onward)   Start     Dose/Rate Route Frequency Ordered Stop   06/15/17 2200  ciprofloxacin (CIPRO) IVPB 400 mg     400 mg 200 mL/hr over 60 Minutes Intravenous Every 12 hours 06/15/17 2143     06/15/17 2200  metroNIDAZOLE (FLAGYL) IVPB 500 mg     500 mg 100 mL/hr over 60 Minutes Intravenous Every 8 hours 06/15/17 2143     06/15/17 2015  piperacillin-tazobactam (ZOSYN) IVPB 3.375 g     3.375 g 100 mL/hr over 30 Minutes Intravenous  Once 06/15/17 2007 06/15/17 2110        Objective:   Vitals:   06/16/17 0506 06/16/17 1354 06/16/17 2147 06/17/17 0515  BP: 110/85 127/81 (!) 129/91 117/73  Pulse: 78 75 89 74  Resp: 18 12 18 18   Temp: 98.5 F (36.9 C) 98.1 F (36.7 C) 98.5 F (36.9 C) 98.3 F (36.8 C)  TempSrc: Oral Oral Oral Oral  SpO2: 97% 94% 97% 96%  Weight:   82.1 kg (181 lb)   Height:   5' 8.5" (1.74 m)     Wt Readings from Last 3 Encounters:  06/16/17 82.1 kg (181 lb)  06/10/17 82.1 kg  (181 lb 1.6 oz)  11/06/16 82.6 kg (182 lb)     Intake/Output Summary (Last 24 hours) at 06/17/2017 0744 Last data filed at 06/17/2017 0543 Gross per 24 hour  Intake 2060 ml  Output -  Net 2060 ml     Physical Exam  Awake Alert, Oriented X 3, No new F.N deficits, Normal affect [place].AT,PERRAL Supple Neck,No JVD, No cervical lymphadenopathy appriciated.  Symmetrical Chest wall movement, Good air movement bilaterally, CTAB RRR,No Gallops,Rubs or new Murmurs, No Parasternal Heave +ve B.Sounds, Abd Soft, No tenderness, No organomegaly appriciated, No rebound - guarding or rigidity. No Cyanosis, Clubbing or edema, No new Rash or bruise     Data Review:    CBC Recent Labs  Lab 06/10/17 0911 06/15/17 1727 06/16/17 0714 06/17/17 0359  WBC 6.6 10.8* 7.5 6.9  HGB 15.2 14.4 12.9* 12.8*  HCT 45.2 43.8 39.7 40.3  PLT 300.0 215 189 208  MCV 87.1 87.1 88.6 88.6  MCH  --  28.6 28.8 28.1  MCHC 33.5 32.9 32.5 31.8  RDW 13.8 13.6 14.1 13.5  LYMPHSABS 2.2 3.0  --   --   MONOABS 0.4 0.9  --   --   EOSABS 0.2 0.3  --   --   BASOSABS 0.0 0.0  --   --     Chemistries  Recent Labs  Lab 06/10/17 0911 06/15/17 1730 06/16/17 0714 06/17/17 0359  NA 138 137 139 139  K 4.8  4.0 4.3 3.7  CL 102 106 105 107  CO2 28 21* 25 25  GLUCOSE 94 94 108* 135*  BUN 19 15 10 9   CREATININE 0.96 1.04 1.14 1.01  CALCIUM 9.2 8.5* 8.1* 8.2*  AST 17 22 18 20   ALT 19 18 17 18   ALKPHOS 49 58 42 46  BILITOT 0.5 0.5 0.7 0.2*   ------------------------------------------------------------------------------------------------------------------ No results for input(s): CHOL, HDL, LDLCALC, TRIG, CHOLHDL, LDLDIRECT in the last 72 hours.  No results found for: HGBA1C ------------------------------------------------------------------------------------------------------------------ No results for input(s): TSH, T4TOTAL, T3FREE, THYROIDAB in the last 72 hours.  Invalid input(s):  FREET3 ------------------------------------------------------------------------------------------------------------------ No results for input(s): VITAMINB12, FOLATE, FERRITIN, TIBC, IRON, RETICCTPCT in the last 72 hours.  Coagulation profile No results for input(s): INR, PROTIME in the last 168 hours.  No results for input(s): DDIMER in the last 72 hours.  Cardiac Enzymes No results for input(s): CKMB, TROPONINI, MYOGLOBIN in the last 168 hours.  Invalid input(s): CK ------------------------------------------------------------------------------------------------------------------ No results found for: BNP  Inpatient Medications  Scheduled Meds: . enoxaparin (LOVENOX) injection  40 mg Subcutaneous Q24H   Continuous Infusions: . ciprofloxacin Stopped (06/16/17 2253)  . dextrose 5 % and 0.9% NaCl 125 mL/hr at 06/17/17 0543  . metronidazole Stopped (06/17/17 0646)   PRN Meds:.ketorolac, morphine injection, ondansetron **OR** ondansetron (ZOFRAN) IV  Micro Results No results found for this or any previous visit (from the past 240 hour(s)).  Radiology Reports Ct Abdomen Pelvis W Contrast  Result Date: 06/15/2017 CLINICAL DATA:  Left lower quadrant pain EXAM: CT ABDOMEN AND PELVIS WITH CONTRAST TECHNIQUE: Multidetector CT imaging of the abdomen and pelvis was performed using the standard protocol following bolus administration of intravenous contrast. CONTRAST:  ISOVUE-300 IOPAMIDOL (ISOVUE-300) INJECTION 61% COMPARISON:  None. FINDINGS: Lower chest: Unremarkable Hepatobiliary: No focal abnormality within the liver parenchyma. There is no evidence for gallstones, gallbladder wall thickening, or pericholecystic fluid. No intrahepatic or extrahepatic biliary dilation. Pancreas: No focal mass lesion. No dilatation of the main duct. No intraparenchymal cyst. No peripancreatic edema. Spleen: No splenomegaly. No focal mass lesion. Adrenals/Urinary Tract: No adrenal nodule or mass. 3 mm  nonobstructing stone identified in the upper pole of the right kidney. Left kidney unremarkable. No evidence for hydroureter. The urinary bladder appears normal for the degree of distention. Stomach/Bowel: Stomach is nondistended. No gastric wall thickening. No evidence of outlet obstruction. Duodenum is normally positioned as is the ligament of Treitz. No small bowel wall thickening. No small bowel dilatation. The terminal ileum is normal. The appendix is normal. Diverticular changes are noted in the left colon. There is a segment of wall thickening in the proximal sigmoid segment with pericolonic edema/inflammation and a small amount of fluid in the root of the sigmoid mesocolon. Imaging features compatible with acute diverticulitis. No evidence for perforation. Sagittal imaging demonstrates a tiny 1.4 cm rim enhancing collection adjacent to this area of abnormal colon, compatible with a tiny abscess. Vascular/Lymphatic: No abdominal aortic aneurysm. There is no gastrohepatic or hepatoduodenal ligament lymphadenopathy. No intraperitoneal or retroperitoneal lymphadenopathy. No pelvic sidewall lymphadenopathy. Reproductive: Prostate gland upper normal for size. Other: No intraperitoneal free fluid. Musculoskeletal: Bone windows reveal no worrisome lytic or sclerotic osseous lesions. IMPRESSION: 1. Left colonic diverticulosis with a short segment of sigmoid colon demonstrating wall thickening and pericolonic edema/inflammation. Imaging features consistent with acute diverticulitis. No evidence for perforation or percutaneously drainable abscess although there is a tiny 14 mm abscess in the fat adjacent to the inflamed colon. 2. Nonobstructing 3 mm right  renal stone. Electronically Signed   By: Kennith CenterEric  Mansell M.D.   On: 06/15/2017 19:39    Time Spent in minutes  30   Pearson GrippeJames Aleczander Fandino M.D on 06/17/2017 at 7:44 AM  Between 7am to 7pm - Pager - [phone]    After 7pm go to [email or web address] - password [email or web address]  [facility] -  Office  [phone]

## 2017-06-17 NOTE — Discharge Summary (Signed)
Patient discharge teaching has been implemented and IV has been removed. The patient was stable at discharge.

## 2017-06-17 NOTE — Discharge Summary (Signed)
Adam Oconnell, is a 52 y.o. male  DOB 04/29/1965  MRN [id number].  Admission date:  06/15/2017  Admitting Physician  Rometta Emery, MD  Discharge Date:  06/17/2017   Primary MD  Kristian Covey, MD  Recommendations for primary care physician for things to follow:     #1 acute diverticulitis with abscess: cipro 500mg  po bid x 14 days Flagyl 500mg  po tid x 14 days Check cbc in 1 week with pcp Please f/u with GI in 2 weeks  (Buccini)  #2 leukocytosis:  Improved Repeat cbc as above  #3 history of asthma: Cont ProAir HFA   #4 hyperlipidemia: Not on any home medications.       Admission Diagnosis  abd pain   Discharge Diagnosis  abd pain      Principal Problem:   Colonic diverticular abscess Active Problems:   Asthma, mild intermittent   Dyslipidemia      Past Medical History:  Diagnosis Date  . Acute diverticulitis 06/15/2017   w/abscess/notes 06/16/2017  . Asthma   . Pneumonia 1980s X 1  . Seasonal allergies   . Stomach ulcer    "flares up once in a blue moon; last flareup was in ~ 2018" (06/16/2017)    Past Surgical History:  Procedure Laterality Date  . KNEE ARTHROSCOPY Right 12/2016   MCL; ACL       HPI  from the history and physical done on the day of admission:    51 y.o.malewith medical history significant ofNo significant medical history presenting with abdominal pain, for 4 days. Pain is at 6/10 now and has gone from 2/10 to 10/10 sometimes. No NVD. Has never had this problem before. Patient had mild Diarrhea. It is exacerbated by meals and not relieved by anything.Patient did not take any medications. He came to the ER where he was found to have diverticulitis.  ED Course:white count is 10.8 thousand with normal hemoglobin. Normal chemistry CT abdomen and pelvis showed1. Left colonic diverticulosis with a shortsegment of sigmoid colon  demonstrating wall thickening and pericolonic edema/inflammation. Imaging features consistent with acute diverticulitis. No evidence for perforation or percutaneously drainable abscess although there is a tiny 14 mm abscess in the fat adjacent to the inflamed colon. 2. Nonobstructing 3 mm right renal stone.IV Zosyn was initiated.        Hospital Course:     Pt tx with cipro and flagyl iv and felt better so diet was advanced .  He has been afebrile since admission. Appears to be doing well.  Surgery was consulted and follow and gave guidance throughout the admission. Pt was advanced to full regular diet and tolerated this AM.  Pt will be discharged to home on po abx.    Follow UP     Consults obtained -  surgery  Discharge Condition: stable  Diet and Activity recommendation: See Discharge Instructions below  Discharge Instructions       Discharge Medications     Allergies as of 06/17/2017  No Known Allergies     Medication List    STOP taking these medications   ibuprofen 200 MG tablet Commonly known as:  ADVIL,MOTRIN     TAKE these medications   albuterol 108 (90 Base) MCG/ACT inhaler Commonly known as:  PROVENTIL HFA;VENTOLIN HFA Inhale 2 puffs into the lungs every 6 (six) hours as needed for wheezing or shortness of breath.   ciprofloxacin 500 MG tablet Commonly known as:  CIPRO Take 1 tablet (500 mg total) by mouth 2 (two) times daily for 14 days.   metroNIDAZOLE 500 MG tablet Commonly known as:  FLAGYL Take 1 tablet (500 mg total) by mouth 3 (three) times daily for 14 days.   ondansetron 4 MG tablet Commonly known as:  ZOFRAN Take 1 tablet (4 mg total) by mouth every 6 (six) hours as needed for nausea.       Major procedures and Radiology Reports - PLEASE review detailed and final reports for all details, in brief -      Ct Abdomen Pelvis W Contrast  Result Date: 06/15/2017 CLINICAL DATA:  Left lower quadrant pain EXAM: CT ABDOMEN AND PELVIS  WITH CONTRAST TECHNIQUE: Multidetector CT imaging of the abdomen and pelvis was performed using the standard protocol following bolus administration of intravenous contrast. CONTRAST:  ISOVUE-300 IOPAMIDOL (ISOVUE-300) INJECTION 61% COMPARISON:  None. FINDINGS: Lower chest: Unremarkable Hepatobiliary: No focal abnormality within the liver parenchyma. There is no evidence for gallstones, gallbladder wall thickening, or pericholecystic fluid. No intrahepatic or extrahepatic biliary dilation. Pancreas: No focal mass lesion. No dilatation of the main duct. No intraparenchymal cyst. No peripancreatic edema. Spleen: No splenomegaly. No focal mass lesion. Adrenals/Urinary Tract: No adrenal nodule or mass. 3 mm nonobstructing stone identified in the upper pole of the right kidney. Left kidney unremarkable. No evidence for hydroureter. The urinary bladder appears normal for the degree of distention. Stomach/Bowel: Stomach is nondistended. No gastric wall thickening. No evidence of outlet obstruction. Duodenum is normally positioned as is the ligament of Treitz. No small bowel wall thickening. No small bowel dilatation. The terminal ileum is normal. The appendix is normal. Diverticular changes are noted in the left colon. There is a segment of wall thickening in the proximal sigmoid segment with pericolonic edema/inflammation and a small amount of fluid in the root of the sigmoid mesocolon. Imaging features compatible with acute diverticulitis. No evidence for perforation. Sagittal imaging demonstrates a tiny 1.4 cm rim enhancing collection adjacent to this area of abnormal colon, compatible with a tiny abscess. Vascular/Lymphatic: No abdominal aortic aneurysm. There is no gastrohepatic or hepatoduodenal ligament lymphadenopathy. No intraperitoneal or retroperitoneal lymphadenopathy. No pelvic sidewall lymphadenopathy. Reproductive: Prostate gland upper normal for size. Other: No intraperitoneal free fluid.  Musculoskeletal: Bone windows reveal no worrisome lytic or sclerotic osseous lesions. IMPRESSION: 1. Left colonic diverticulosis with a short segment of sigmoid colon demonstrating wall thickening and pericolonic edema/inflammation. Imaging features consistent with acute diverticulitis. No evidence for perforation or percutaneously drainable abscess although there is a tiny 14 mm abscess in the fat adjacent to the inflamed colon. 2. Nonobstructing 3 mm right renal stone. Electronically Signed   By: Kennith Center M.D.   On: 06/15/2017 19:39    Micro Results     No results found for this or any previous visit (from the past 240 hour(s)).     Today   Subjective    Adam Oconnell today is feeling much better. No abdominal pain.  Afebrile.  Denies n/v, diarrhea, brbpr.   No  headache,no new weakness tingling or numbness, feels much better wants to go home today.   Objective   Blood pressure (!) 144/90, pulse 70, temperature 98.6 F (37 C), temperature source Oral, resp. rate 14, height 5' 8.5" (1.74 m), weight 82.1 kg (181 lb), SpO2 100 %.   Intake/Output Summary (Last 24 hours) at 06/17/2017 1229 Last data filed at 06/17/2017 1100 Gross per 24 hour  Intake 2360 ml  Output -  Net 2360 ml    Exam Awake Alert, Oriented x 3, No new F.N deficits, Normal affect [place].AT,PERRAL Supple Neck,No JVD, No cervical lymphadenopathy appriciated.  Symmetrical Chest wall movement, Good air movement bilaterally, CTAB RRR,No Gallops,Rubs or new Murmurs, No Parasternal Heave +ve B.Sounds, Abd Soft, Non tender, No organomegaly appriciated, No rebound -guarding or rigidity. No Cyanosis, Clubbing or edema, No new Rash or bruise   Data Review   CBC w Diff:  Lab Results  Component Value Date   WBC 6.9 06/17/2017   HGB 12.8 (L) 06/17/2017   HCT 40.3 06/17/2017   PLT 208 06/17/2017   LYMPHOPCT 28 06/15/2017   MONOPCT 8 06/15/2017   EOSPCT 3 06/15/2017   BASOPCT 0 06/15/2017    CMP:  Lab Results    Component Value Date   NA 139 06/17/2017   K 3.7 06/17/2017   CL 107 06/17/2017   CO2 25 06/17/2017   BUN 9 06/17/2017   CREATININE 1.01 06/17/2017   PROT 5.5 (L) 06/17/2017   ALBUMIN 3.0 (L) 06/17/2017   BILITOT 0.2 (L) 06/17/2017   ALKPHOS 46 06/17/2017   AST 20 06/17/2017   ALT 18 06/17/2017  .   Total Time in preparing paper work, data evaluation and todays exam - 35 minutes  Pearson GrippeJames Shareese Macha M.D on 06/17/2017 at 12:29 PM  [facility]   Office  [phone]

## 2017-06-17 NOTE — Progress Notes (Signed)
Adam Oconnell to be D/C'd  per MD order. Discussed with the patient and all questions fully answered.  VSS, Skin clean, dry and intact without evidence of skin break down, no evidence of skin tears noted.  IV catheter discontinued intact. Site without signs and symptoms of complications. Dressing and pressure applied.  An After Visit Summary was printed and given to the patient. Patient received prescription.  D/c education completed with patient/family including follow up instructions, medication list, d/c activities limitations if indicated, with other d/c instructions as indicated by MD - patient able to verbalize understanding, all questions fully answered.   Patient instructed to return to ED, call 911, or call MD for any changes in condition.   Patient to be escorted via WC, and D/C home via private auto.

## 2017-06-18 ENCOUNTER — Telehealth: Payer: Self-pay | Admitting: Family Medicine

## 2017-06-18 NOTE — Telephone Encounter (Signed)
Transition Care Management Follow-up Telephone Call  Adam Oconnell, is a 52 y.o. male  DOB Mar 04, 1966  MRN [id number].  Admission date:  06/15/2017  Admitting Physician  Rometta EmeryMohammad L Garba, MD  Discharge Date:  06/17/2017   Primary MD  Kristian CoveyBurchette, Bruce W, MD  Recommendations for primary care physician for things to follow:     #1 acute diverticulitis with abscess: cipro 500mg  po bid x 14 days Flagyl 500mg  po tid x 14 days Check cbc in 1 week with pcp Please f/u with GI in 2 weeks  (Buccini)  #2 leukocytosis:  Improved Repeat cbc as above   How have you been since you were released from the hospital? "better"   Do you understand why you were in the hospital? yes   Do you understand the discharge instructions? yes   Where were you discharged to? Home   Items Reviewed:  Medications reviewed: yes  Allergies reviewed: yes  Dietary changes reviewed: yes  Referrals reviewed: yes   Functional Questionnaire:   Activities of Daily Living (ADLs):   He states they are independent in the following: ambulation, bathing and hygiene, feeding, continence, grooming, toileting and dressing States they require assistance with the following: none   Any transportation issues/concerns?: no   Any patient concerns? no   Confirmed importance and date/time of follow-up visits scheduled yes  Provider Appointment booked with Dr. Caryl NeverBurchette on 07/01/2017 Tuesday at 3:00 pm, pt is going out of town for 1 week, will see provider once returning.   Confirmed with patient if condition begins to worsen call PCP or go to the ER.  Patient was given the office number and encouraged to call back with question or concerns.  : yes

## 2017-07-01 ENCOUNTER — Inpatient Hospital Stay: Payer: 59 | Admitting: Family Medicine

## 2017-07-08 ENCOUNTER — Encounter: Payer: Self-pay | Admitting: Family Medicine

## 2017-07-08 ENCOUNTER — Ambulatory Visit: Payer: 59 | Admitting: Family Medicine

## 2017-07-08 VITALS — BP 110/80 | HR 76 | Temp 98.1°F | Wt 179.4 lb

## 2017-07-08 DIAGNOSIS — K5720 Diverticulitis of large intestine with perforation and abscess without bleeding: Secondary | ICD-10-CM | POA: Diagnosis not present

## 2017-07-08 DIAGNOSIS — Z23 Encounter for immunization: Secondary | ICD-10-CM

## 2017-07-08 LAB — CBC WITH DIFFERENTIAL/PLATELET
Basophils Absolute: 0.1 10*3/uL (ref 0.0–0.1)
Basophils Relative: 0.8 % (ref 0.0–3.0)
EOS ABS: 0.2 10*3/uL (ref 0.0–0.7)
EOS PCT: 2.7 % (ref 0.0–5.0)
HCT: 47.2 % (ref 39.0–52.0)
Hemoglobin: 15.9 g/dL (ref 13.0–17.0)
LYMPHS ABS: 2.4 10*3/uL (ref 0.7–4.0)
Lymphocytes Relative: 37.8 % (ref 12.0–46.0)
MCHC: 33.7 g/dL (ref 30.0–36.0)
MCV: 86.2 fl (ref 78.0–100.0)
MONO ABS: 0.5 10*3/uL (ref 0.1–1.0)
Monocytes Relative: 7.1 % (ref 3.0–12.0)
NEUTROS PCT: 51.6 % (ref 43.0–77.0)
Neutro Abs: 3.3 10*3/uL (ref 1.4–7.7)
Platelets: 330 10*3/uL (ref 150.0–400.0)
RBC: 5.48 Mil/uL (ref 4.22–5.81)
RDW: 14.1 % (ref 11.5–15.5)
WBC: 6.4 10*3/uL (ref 4.0–10.5)

## 2017-07-08 NOTE — Progress Notes (Signed)
  Subjective:     Patient ID: Adam Oconnell, male   DOB: 1965/09/10, 52 y.o.   MRN: [id number]  HPI  Patient seen for hospital follow-up. He was admitted on the seventh with acute diverticulitis. This was a first-time episode. This involved the sigmoid colon. He had some wall thickening and pericolonic edema/inflammation consistent with probable abscess but no evidence for perforation. Patient was placed on Cipro and Flagyl and had prompt improvement. White count initially 10.8 thousand.   He's had no problems since discharge. No recurrent abdominal pain. No fevers or chills. Appetite normal.  Patient had colonoscopy October 2010 with recommended 10 year follow-up.  Is requesting shingles vaccine. No contraindications.  Past Medical History:  Diagnosis Date  . Acute diverticulitis 06/15/2017   w/abscess/notes 06/16/2017  . Asthma   . Pneumonia 1980s X 1  . Seasonal allergies   . Stomach ulcer    "flares up once in a blue moon; last flareup was in ~ 2018" (06/16/2017)   Past Surgical History:  Procedure Laterality Date  . KNEE ARTHROSCOPY Right 12/2016   MCL; ACL    reports that he has never smoked. He has never used smokeless tobacco. He reports that he drinks alcohol. He reports that he does not use drugs. family history includes Cancer (age of onset: 65) in his mother; Heart disease in his maternal uncle; Heart disease (age of onset: 83) in his mother. No Known Allergies  Review of Systems  Constitutional: Negative for chills and fever.  Respiratory: Negative for shortness of breath.   Cardiovascular: Negative for chest pain and palpitations.  Gastrointestinal: Positive for abdominal pain. Negative for abdominal distention, blood in stool, nausea and vomiting.  Genitourinary: Negative for dysuria.       Objective:   Physical Exam  Constitutional: He appears well-developed and well-nourished.  Cardiovascular: Normal rate and regular rhythm.  Pulmonary/Chest: Effort normal  and breath sounds normal.  Abdominal: Soft. Bowel sounds are normal. He exhibits no mass. There is no tenderness. There is no rebound and no guarding.  Genitourinary:  Genitourinary Comments: Patient has mobile nontender approximately 1 cm well-circumscribed fluid collection just superior to the left testicle. This is compatible with likely spermatocele or possibly small hydrocele. 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    Plan:     -Follow-up immediately for any recurrent abdominal pain or other concerns -Repeat CBC today -Patient requesting new shingles vaccine this is given Repeat Shingrix in 2 to 6 months. -Patient will need repeat colonoscopy by 2020  Kristian Covey MD [facility]

## 2017-07-08 NOTE — Patient Instructions (Signed)
Diverticulitis °Diverticulitis is infection or inflammation of small pouches (diverticula) in the colon that form due to a condition called diverticulosis. Diverticula can trap stool (feces) and bacteria, causing infection and inflammation. °Diverticulitis may cause severe stomach pain and diarrhea. It may lead to tissue damage in the colon that causes bleeding. The diverticula may also burst (rupture) and cause infected stool to enter other areas of the abdomen. °Complications of diverticulitis can include: °· Bleeding. °· Severe infection. °· Severe pain. °· Rupture (perforation) of the colon. °· Blockage (obstruction) of the colon. ° °What are the causes? °This condition is caused by stool becoming trapped in the diverticula, which allows bacteria to grow in the diverticula. This leads to inflammation and infection. °What increases the risk? °You are more likely to develop this condition if: °· You have diverticulosis. The risk for diverticulosis increases if: °? You are overweight or obese. °? You use tobacco products. °? You do not get enough exercise. °· You eat a diet that does not include enough fiber. High-fiber foods include fruits, vegetables, beans, nuts, and whole grains. ° °What are the signs or symptoms? °Symptoms of this condition may include: °· Pain and tenderness in the abdomen. The pain is normally located on the left side of the abdomen, but it may occur in other areas. °· Fever and chills. °· Bloating. °· Cramping. °· Nausea. °· Vomiting. °· Changes in bowel routines. °· Blood in your stool. ° °How is this diagnosed? °This condition is diagnosed based on: °· Your medical history. °· A physical exam. °· Tests to make sure there is nothing else causing your condition. These tests may include: °? Blood tests. °? Urine tests. °? Imaging tests of the abdomen, including X-rays, ultrasounds, MRIs, or CT scans. ° °How is this treated? °Most cases of this condition are mild and can be treated at home.  Treatment may include: °· Taking over-the-counter pain medicines. °· Following a clear liquid diet. °· Taking antibiotic medicines by mouth. °· Rest. ° °More severe cases may need to be treated at a hospital. Treatment may include: °· Not eating or drinking. °· Taking prescription pain medicine. °· Receiving antibiotic medicines through an IV tube. °· Receiving fluids and nutrition through an IV tube. °· Surgery. ° °When your condition is under control, your health care provider may recommend that you have a colonoscopy. This is an exam to look at the entire large intestine. During the exam, a lubricated, bendable tube is inserted into the anus and then passed into the rectum, colon, and other parts of the large intestine. A colonoscopy can show how severe your diverticula are and whether something else may be causing your symptoms. °Follow these instructions at home: °Medicines °· Take over-the-counter and prescription medicines only as told by your health care provider. These include fiber supplements, probiotics, and stool softeners. °· If you were prescribed an antibiotic medicine, take it as told by your health care provider. Do not stop taking the antibiotic even if you start to feel better. °· Do not drive or use heavy machinery while taking prescription pain medicine. °General instructions °· Follow a full liquid diet or another diet as directed by your health care provider. After your symptoms improve, your health care provider may tell you to change your diet. He or she may recommend that you eat a diet that contains at least 25 g (25 grams) of fiber daily. Fiber makes it easier to pass stool. Healthy sources of fiber include: °? Berries. One cup   contains 4-8 grams of fiber. °? Beans or lentils. One half cup contains 5-8 grams of fiber. °? Green vegetables. One cup contains 4 grams of fiber. °· Exercise for at least 30 minutes, 3 times each week. You should exercise hard enough to raise your heart rate and  break a sweat. °· Keep all follow-up visits as told by your health care provider. This is important. You may need a colonoscopy. °Contact a health care provider if: °· Your pain does not improve. °· You have a hard time drinking or eating food. °· Your bowel movements do not return to normal. °Get help right away if: °· Your pain gets worse. °· Your symptoms do not get better with treatment. °· Your symptoms suddenly get worse. °· You have a fever. °· You vomit more than one time. °· You have stools that are bloody, black, or tarry. °Summary °· Diverticulitis is infection or inflammation of small pouches (diverticula) in the colon that form due to a condition called diverticulosis. Diverticula can trap stool (feces) and bacteria, causing infection and inflammation. °· You are at higher risk for this condition if you have diverticulosis and you eat a diet that does not include enough fiber. °· Most cases of this condition are mild and can be treated at home. More severe cases may need to be treated at a hospital. °· When your condition is under control, your health care provider may recommend that you have an exam called a colonoscopy. This exam can show how severe your diverticula are and whether something else may be causing your symptoms. °This information is not intended to replace advice given to you by your health care provider. Make sure you discuss any questions you have with your health care provider. °Document Released: 12/05/2004 Document Revised: 03/30/2016 Document Reviewed: 03/30/2016 °[employer] Interactive Patient Education © 2018 [employer] ° °

## 2017-12-16 ENCOUNTER — Encounter: Payer: Self-pay | Admitting: Family Medicine

## 2017-12-16 HISTORY — PX: AORTIC VALVE SURGERY: SHX549

## 2017-12-22 ENCOUNTER — Encounter: Payer: Self-pay | Admitting: Family Medicine

## 2017-12-22 ENCOUNTER — Ambulatory Visit: Payer: 59 | Admitting: Family Medicine

## 2017-12-22 VITALS — BP 130/80 | HR 81 | Temp 98.3°F | Resp 12 | Ht 68.5 in | Wt 181.0 lb

## 2017-12-22 DIAGNOSIS — J019 Acute sinusitis, unspecified: Secondary | ICD-10-CM | POA: Diagnosis not present

## 2017-12-22 DIAGNOSIS — Z8719 Personal history of other diseases of the digestive system: Secondary | ICD-10-CM | POA: Insufficient documentation

## 2017-12-22 MED ORDER — FLUTICASONE PROPIONATE 50 MCG/ACT NA SUSP: 1.0000 | Freq: Two times a day (BID) | NASAL | 2 refills | Status: DC

## 2017-12-22 MED ORDER — AMOXICILLIN-POT CLAVULANATE 875-125 MG PO TABS: 1.0000 | ORAL_TABLET | Freq: Two times a day (BID) | ORAL | 0 refills | Status: AC

## 2017-12-22 NOTE — Progress Notes (Signed)
ACUTE VISIT  HPI:  Chief Complaint  Patient presents with  . Headache    severe headache, sx started about 1 month ago  . Sinusitis  . Nasal Congestion    bloody discharge, started 2 weeks ago    Mr.Kaeson B Tibbetts is a 52 y.o.male here today complaining of respiratory symptoms that started about a month ago and worse for the past 2 weeks. Bloody rhinorrhea and left frontal "extreme" headache. Mild non productive cough,nasal congestion,and post nasal drainage. Symptoms are worse in the morning.  He has not noted fever or chills.  Sinus Problem  This is a new problem. The current episode started 1 to 4 weeks ago. The problem has been waxing and waning since onset. There has been no fever. The pain is moderate. Associated symptoms include congestion, coughing and headaches. Pertinent negatives include no chills, ear pain, shortness of breath or sore throat. Past treatments include oral decongestants. The treatment provided mild relief.     No Hx of recent travel. No sick contact. No known insect bite.  Hx of allergies: Yes. Hx of asthma. He has not noted wheezing or dyspnea.   OTC medications for this problem: Cold meds and Ibuprofen.  He is also following on recent hospitalization and surgery. On 12/16/2017 he presented to [facility] complaining of a day of epigastric and midline abdominal pain.  On 12/16/2017 he underwent abdominal surgery, diagnostic laparoscopy converted to exploratory laparoscopic with diagnosis of small bowel obstruction and lysis of adhesions.  .  According to patient, he was instructed to follow with PCP, he does not have to see surgeon unless he develops complications.  According to pt, he did not need abx treatment. He has not had fever,chills,or blood in stool.  Appetite is "good." Last bowel movement today. He is passing gas. Diffuse abdominal pain has improved, now is 1/10. No urinary symptoms.    Review of Systems    Constitutional: Positive for fatigue. Negative for activity change, appetite change, chills and fever.  HENT: Positive for congestion, rhinorrhea and sinus pain. Negative for ear pain, facial swelling, mouth sores, postnasal drip, sore throat, trouble swallowing and voice change.   Eyes: Negative for discharge, redness and itching.  Respiratory: Positive for cough. Negative for chest tightness, shortness of breath and wheezing.   Gastrointestinal: Negative for abdominal pain, blood in stool, nausea and vomiting.  Genitourinary: Negative for decreased urine volume, dysuria and hematuria.  Musculoskeletal: Negative for gait problem and myalgias.  Skin: Positive for wound. Negative for rash.  Allergic/Immunologic: Positive for environmental allergies.  Neurological: Positive for headaches. Negative for weakness.      Current Outpatient Medications on File Prior to Visit  Medication Sig Dispense Refill  . albuterol (PROVENTIL HFA;VENTOLIN HFA) 108 (90 Base) MCG/ACT inhaler Inhale 2 puffs into the lungs every 6 (six) hours as needed for wheezing or shortness of breath.    Marland Kitchen ibuprofen (ADVIL,MOTRIN) 800 MG tablet Take 800 mg by mouth every 8 (eight) hours as needed.     No current facility-administered medications on file prior to visit.      Past Medical History:  Diagnosis Date  . Acute diverticulitis 06/15/2017   w/abscess/notes 06/16/2017  . Asthma   . Pneumonia 1980s X 1  . Seasonal allergies   . Stomach ulcer    "flares up once in a blue moon; last flareup was in ~ 2018" (06/16/2017)   No Known Allergies  Social History   Socioeconomic History  .  Marital status: Married    Spouse name: Not on file  . Number of children: Not on file  . Years of education: Not on file  . Highest education level: Not on file  Occupational History  . Not on file  Social Needs  . Financial resource strain: Not on file  . Food insecurity:    Worry: Not on file    Inability: Not on file  .  Transportation needs:    Medical: Not on file    Non-medical: Not on file  Tobacco Use  . Smoking status: Never Smoker  . Smokeless tobacco: Never Used  Substance and Sexual Activity  . Alcohol use: Yes    Comment: 06/16/2017 "couple drinks q couple weeks"  . Drug use: No  . Sexual activity: Yes  Lifestyle  . Physical activity:    Days per week: Not on file    Minutes per session: Not on file  . Stress: Not on file  Relationships  . Social connections:    Talks on phone: Not on file    Gets together: Not on file    Attends religious service: Not on file    Active member of club or organization: Not on file    Attends meetings of clubs or organizations: Not on file    Relationship status: Not on file  Other Topics Concern  . Not on file  Social History Narrative  . Not on file    Vitals:   12/22/17 1458  BP: 130/80  Pulse: 81  Resp: 12  Temp: 98.3 F (36.8 C)  SpO2: 99%   Body mass index is 27.12 kg/m.   Physical Exam  Nursing note and vitals reviewed. Constitutional: He is oriented to person, place, and time. He appears well-developed and well-nourished. He does not appear ill. No distress.  HENT:  Head: Normocephalic and atraumatic.  Nose: Rhinorrhea and septal deviation present. Right sinus exhibits maxillary sinus tenderness. Right sinus exhibits no frontal sinus tenderness. Left sinus exhibits no maxillary sinus tenderness and no frontal sinus tenderness.  Mouth/Throat: Oropharynx is clear and moist and mucous membranes are normal.  Eyes: Conjunctivae and EOM are normal.  Cardiovascular: Normal rate and regular rhythm.  No murmur heard. Respiratory: Effort normal and breath sounds normal. No stridor. No respiratory distress.  GI: Soft. Bowel sounds are normal. He exhibits no mass. There is no tenderness.  Linear wound,mid abdomen. No erythema or drainage. See picture.  Lymphadenopathy:       Head (right side): No submandibular adenopathy present.        Head (left side): No submandibular adenopathy present.    He has no cervical adenopathy.  Neurological: He is alert and oriented to person, place, and time. He has normal strength.  Skin: Skin is warm. No rash noted. No erythema.  Psychiatric: He has a normal mood and affect. His speech is normal.  Well groomed, good eye contact.        ASSESSMENT AND PLAN:  Mr. Kinta was seen today for headache, sinusitis and nasal congestion.  Diagnoses and all orders for this visit:  Acute sinusitis, recurrence not specified, unspecified location  Side effects of abx discussed. Nasal irrigations with saline several times per day and Flonase nasal spray. OTC antihistaminic also recommended. F/U with PCP is not better in 10-14 days.  -     amoxicillin-clavulanate (AUGMENTIN) 875-125 MG tablet; Take 1 tablet by mouth 2 (two) times daily for 10 days. -     fluticasone (FLONASE)  50 MCG/ACT nasal spray; Place 1 spray into both nostrils 2 (two) times daily.  H/O small bowel obstruction  He brought records of recent ER visit and hospitalization,reviewed.  Recovering well from exploratory laparoscopy. Clearly instructed about warning signs. Follow-up as needed.    Trysten Bernard G. [place], MD  [facility]. [place] office.

## 2017-12-22 NOTE — Patient Instructions (Addendum)
A few things to remember from today's visit:   FH: bowel obstruction  Acute sinusitis, recurrence not specified, unspecified location - Plan: amoxicillin-clavulanate (AUGMENTIN) 875-125 MG tablet, fluticasone (FLONASE) 50 MCG/ACT nasal spray  Saline irrigations several times per day with saline. Over-the-counter antihistaminic as Allegra or Zyrtec may help as well as decongestants. If not better after completing antibiotics please follow-up with your PCP.  Please be sure medication list is accurate. If a new problem present, please set up appointment sooner than planned today.

## 2017-12-24 ENCOUNTER — Telehealth: Payer: Self-pay | Admitting: *Deleted

## 2017-12-24 NOTE — Telephone Encounter (Signed)
Copied from CRM [id number]. Topic: General - Other >> Dec 24, 2017 10:46 AM Adam Oconnell wrote:  Pt would like a return to work note he said he has been out all week and will return on 12/29/17 please contact when note ready

## 2017-12-24 NOTE — Telephone Encounter (Signed)
Okay to give work note?

## 2017-12-24 NOTE — Telephone Encounter (Signed)
It is okay to provide a work note excuse as requested, to go back on 12/29/2017. Thanks,  BJ

## 2017-12-25 NOTE — Telephone Encounter (Signed)
Left message on machine for patient that the letter is ready for pick

## 2017-12-25 NOTE — Telephone Encounter (Signed)
Pt called to check status of letter, would like before 5pm today. Please call when ready to pick up.

## 2018-03-24 ENCOUNTER — Other Ambulatory Visit: Payer: Self-pay

## 2018-03-24 ENCOUNTER — Ambulatory Visit: Payer: 59 | Admitting: Family Medicine

## 2018-03-24 ENCOUNTER — Encounter: Payer: Self-pay | Admitting: Family Medicine

## 2018-03-24 VITALS — BP 146/90 | HR 90 | Temp 98.4°F | Ht 68.5 in | Wt 183.9 lb

## 2018-03-24 DIAGNOSIS — J069 Acute upper respiratory infection, unspecified: Secondary | ICD-10-CM

## 2018-03-24 DIAGNOSIS — R062 Wheezing: Secondary | ICD-10-CM

## 2018-03-24 DIAGNOSIS — B9789 Other viral agents as the cause of diseases classified elsewhere: Secondary | ICD-10-CM

## 2018-03-24 MED ORDER — ALBUTEROL SULFATE HFA 108 (90 BASE) MCG/ACT IN AERS: 2.0000 | INHALATION_SPRAY | RESPIRATORY_TRACT | 1 refills | Status: DC | PRN

## 2018-03-24 MED ORDER — METHYLPREDNISOLONE ACETATE 80 MG/ML IJ SUSP
80.0000 mg | Freq: Once | INTRAMUSCULAR | Status: AC
Start: 2018-03-24 — End: 2018-03-24
  Administered 2018-03-24: 80 mg via INTRAMUSCULAR

## 2018-03-24 NOTE — Patient Instructions (Signed)
Follow up for any fever or increased shortness of breath. 

## 2018-03-24 NOTE — Addendum Note (Signed)
Addended by: Ronita Hipps on: 03/24/2018 10:22 AM   Modules accepted: Orders

## 2018-03-24 NOTE — Progress Notes (Signed)
  Subjective:     Patient ID: Adam Oconnell, male   DOB: Nov 27, 1965, 53 y.o.   MRN: [id number]  HPI Patient seen with upper respiratory illness.  Onset last Friday of cough.  Yesterday developed some nasal congestion and intermittent headache and chills but no fever.  Has had some bilateral earache.  He feels he is wheezing some.  Has used albuterol in the past.  Requesting refill.  Only wheezes generally once or twice a year with respiratory infections.  Does have diagnosis of mild intermittent asthma.  Non-smoker.  Past Medical History:  Diagnosis Date  . Acute diverticulitis 06/15/2017   w/abscess/notes 06/16/2017  . Asthma   . Pneumonia 1980s X 1  . Seasonal allergies   . Stomach ulcer    "flares up once in a blue moon; last flareup was in ~ 2018" (06/16/2017)   Past Surgical History:  Procedure Laterality Date  . AORTIC VALVE SURGERY  12/16/2017  . KNEE ARTHROSCOPY Right 12/2016   MCL; ACL    reports that he has never smoked. He has never used smokeless tobacco. He reports current alcohol use. He reports that he does not use drugs. family history includes Cancer (age of onset: 8) in his mother; Heart disease in his maternal uncle; Heart disease (age of onset: 61) in his mother. No Known Allergies   Review of Systems  Constitutional: Positive for chills and fatigue. Negative for fever.  HENT: Positive for congestion.   Respiratory: Positive for cough and wheezing.        Objective:   Physical Exam Constitutional:      Appearance: Normal appearance.  HENT:     Right Ear: Tympanic membrane normal.     Left Ear: Tympanic membrane normal.     Mouth/Throat:     Pharynx: No oropharyngeal exudate or posterior oropharyngeal erythema.  Neck:     Musculoskeletal: Neck supple.  Cardiovascular:     Rate and Rhythm: Normal rate and regular rhythm.  Pulmonary:     Effort: Pulmonary effort is normal. No respiratory distress.     Breath sounds: Wheezing present. No rales.   Lymphadenopathy:     Cervical: No cervical adenopathy.  Neurological:     Mental Status: He is alert.        Assessment:     Viral URI with cough and wheezing.  No respiratory distress    Plan:     -Refill albuterol for as needed use but do not exceed every 4 hours -We discussed steroids- oral versus injection and he requested injection.  Depo-Medrol 80 mg IM given -No indication for antibiotic at this time but if he develops any fever or worsening symptoms to be in touch  Kristian Covey MD [facility]

## 2018-07-23 ENCOUNTER — Ambulatory Visit (INDEPENDENT_AMBULATORY_CARE_PROVIDER_SITE_OTHER): Payer: 59 | Admitting: Family Medicine

## 2018-07-23 ENCOUNTER — Other Ambulatory Visit: Payer: Self-pay

## 2018-07-23 DIAGNOSIS — M545 Low back pain, unspecified: Secondary | ICD-10-CM

## 2018-07-23 MED ORDER — MELOXICAM 15 MG PO TABS: 15.0000 mg | ORAL_TABLET | Freq: Every day | ORAL | 0 refills | Status: DC

## 2018-07-23 MED ORDER — CYCLOBENZAPRINE HCL 10 MG PO TABS: 10.0000 mg | ORAL_TABLET | Freq: Three times a day (TID) | ORAL | 0 refills | Status: DC | PRN

## 2018-07-23 NOTE — Progress Notes (Signed)
Patient ID: Lovena NeighboursClark B Cornacchia, male   DOB: [birth year], 53 y.o.   MRN: [id number]  This visit type was conducted due to national recommendations for restrictions regarding the COVID-19 pandemic in an effort to limit this patient's exposure and mitigate transmission in our community.   Virtual Visit via Video Note  I connected with Waylan Rocherlark Keelan on 07/23/18 at  9:15 AM EDT by a video enabled telemedicine application and verified that I am speaking with the correct person using two identifiers.  Location patient: home Location provider:work or home office Persons participating in the virtual visit: patient, provider  I discussed the limitations of evaluation and management by telemedicine and the availability of in person appointments. The patient expressed understanding and agreed to proceed.   HPI: Low back pain.  Started about 3 weeks ago.  He has been refurbishing a deck.  No specific injury.  Location is lower back somewhat bilateral.  5 out of 10 in severity at its worse.  Worse at night and worse with back flexion.  No radiculitis symptoms.  No numbness.  No weakness.  Tried ibuprofen along with heat and ice without improvement.  Has been doing some back extension exercises.  Ambulating without difficulty no fevers or chills.  No appetite or weight changes.   ROS: See pertinent positives and negatives per HPI.  Past Medical History:  Diagnosis Date  . Acute diverticulitis 06/15/2017   w/abscess/notes 06/16/2017  . Asthma   . Pneumonia 1980s X 1  . Seasonal allergies   . Stomach ulcer    "flares up once in a blue moon; last flareup was in ~ 2018" (06/16/2017)    Past Surgical History:  Procedure Laterality Date  . AORTIC VALVE SURGERY  12/16/2017  . KNEE ARTHROSCOPY Right 12/2016   MCL; ACL    Family History  Problem Relation Age of Onset  . Cancer Mother 5960       uterus  . Heart disease Mother 374       CAD-MI  . Heart disease Maternal Uncle     SOCIAL HX:  Non-smoker   Current Outpatient Medications:  .  albuterol (PROVENTIL HFA;VENTOLIN HFA) 108 (90 Base) MCG/ACT inhaler, Inhale 2 puffs into the lungs every 4 (four) hours as needed for wheezing or shortness of breath., Disp: 1 Inhaler, Rfl: 1 .  cyclobenzaprine (FLEXERIL) 10 MG tablet, Take 1 tablet (10 mg total) by mouth 3 (three) times daily as needed for muscle spasms., Disp: 30 tablet, Rfl: 0 .  fluticasone (FLONASE) 50 MCG/ACT nasal spray, Place 1 spray into both nostrils 2 (two) times daily. (Patient not taking: Reported on 03/24/2018), Disp: 16 g, Rfl: 2 .  meloxicam (MOBIC) 15 MG tablet, Take 1 tablet (15 mg total) by mouth daily., Disp: 30 tablet, Rfl: 0  EXAM:  VITALS per patient if applicable:  GENERAL: alert, oriented, appears well and in no acute distress  HEENT: atraumatic, conjunttiva clear, no obvious abnormalities on inspection of external nose and ears  NECK: normal movements of the head and neck  LUNGS: on inspection no signs of respiratory distress, breathing rate appears normal, no obvious gross SOB, gasping or wheezing  CV: no obvious cyanosis  MS: moves all visible extremities without noticeable abnormality  PSYCH/NEURO: pleasant and cooperative, no obvious depression or anxiety, speech and thought processing grossly intact  ASSESSMENT AND PLAN:  Discussed the following assessment and plan:  Bilateral low back pain.  Suspect musculoskeletal -Continue with stretches and aerobic activity such as walking as tolerated -  Meloxicam 15 mg once daily -Flexeril 10 mg every 8 hours as needed.  He is cautioned about potential sedation -Touch base in a couple weeks if not improving     I discussed the assessment and treatment plan with the patient. The patient was provided an opportunity to ask questions and all were answered. The patient agreed with the plan and demonstrated an understanding of the instructions.   The patient was advised to call back or seek an  in-person evaluation if the symptoms worsen or if the condition fails to improve as anticipated.   Evelena Peat, MD

## 2018-08-15 ENCOUNTER — Other Ambulatory Visit: Payer: Self-pay | Admitting: Family Medicine

## 2018-08-17 NOTE — Telephone Encounter (Signed)
Refill OK

## 2018-09-29 ENCOUNTER — Other Ambulatory Visit: Payer: Self-pay

## 2018-09-29 ENCOUNTER — Ambulatory Visit (INDEPENDENT_AMBULATORY_CARE_PROVIDER_SITE_OTHER): Payer: 59 | Admitting: Family Medicine

## 2018-09-29 ENCOUNTER — Encounter: Payer: Self-pay | Admitting: Family Medicine

## 2018-09-29 VITALS — BP 124/76 | HR 80 | Temp 98.1°F | Ht 68.0 in | Wt 176.7 lb

## 2018-09-29 DIAGNOSIS — Z0000 Encounter for general adult medical examination without abnormal findings: Secondary | ICD-10-CM

## 2018-09-29 LAB — CBC WITH DIFFERENTIAL/PLATELET
Basophils Absolute: 0 10*3/uL (ref 0.0–0.1)
Basophils Relative: 0.5 % (ref 0.0–3.0)
Eosinophils Absolute: 0.1 10*3/uL (ref 0.0–0.7)
Eosinophils Relative: 2.5 % (ref 0.0–5.0)
HCT: 45.4 % (ref 39.0–52.0)
Hemoglobin: 14.9 g/dL (ref 13.0–17.0)
Lymphocytes Relative: 34.5 % (ref 12.0–46.0)
Lymphs Abs: 1.9 10*3/uL (ref 0.7–4.0)
MCHC: 32.9 g/dL (ref 30.0–36.0)
MCV: 88 fl (ref 78.0–100.0)
Monocytes Absolute: 0.4 10*3/uL (ref 0.1–1.0)
Monocytes Relative: 6.7 % (ref 3.0–12.0)
Neutro Abs: 3 10*3/uL (ref 1.4–7.7)
Neutrophils Relative %: 55.8 % (ref 43.0–77.0)
Platelets: 283 10*3/uL (ref 150.0–400.0)
RBC: 5.16 Mil/uL (ref 4.22–5.81)
RDW: 13.7 % (ref 11.5–15.5)
WBC: 5.4 10*3/uL (ref 4.0–10.5)

## 2018-09-29 LAB — BASIC METABOLIC PANEL
BUN: 15 mg/dL (ref 6–23)
CO2: 28 mEq/L (ref 19–32)
Calcium: 9.2 mg/dL (ref 8.4–10.5)
Chloride: 103 mEq/L (ref 96–112)
Creatinine, Ser: 1.05 mg/dL (ref 0.40–1.50)
GFR: 73.43 mL/min (ref >60.00)
Glucose, Bld: 96 mg/dL (ref 70–99)
Potassium: 4.9 mEq/L (ref 3.5–5.1)
Sodium: 139 mEq/L (ref 135–145)

## 2018-09-29 LAB — HEPATIC FUNCTION PANEL
ALT: 26 U/L (ref 0–53)
AST: 19 U/L (ref 0–37)
Albumin: 4.5 g/dL (ref 3.5–5.2)
Alkaline Phosphatase: 51 U/L (ref 39–117)
Bilirubin, Direct: 0.1 mg/dL (ref 0.0–0.3)
Total Bilirubin: 0.5 mg/dL (ref 0.2–1.2)
Total Protein: 7 g/dL (ref 6.0–8.3)

## 2018-09-29 LAB — LIPID PANEL
Cholesterol: 211 mg/dL — ABNORMAL HIGH (ref 0–200)
HDL: 42.2 mg/dL (ref >39.00)
LDL Cholesterol: 129 mg/dL — ABNORMAL HIGH (ref 0–99)
NonHDL: 168.4
Total CHOL/HDL Ratio: 5
Triglycerides: 198 mg/dL — ABNORMAL HIGH (ref 0.0–149.0)
VLDL: 39.6 mg/dL (ref 0.0–40.0)

## 2018-09-29 LAB — PSA: PSA: 1.24 ng/mL (ref 0.10–4.00)

## 2018-09-29 LAB — TSH: TSH: 1.32 u[IU]/mL (ref 0.35–4.50)

## 2018-09-29 NOTE — Progress Notes (Signed)
Subjective:     Patient ID: Adam Oconnell, male   DOB: 1965-07-13, 53 y.o.   MRN: [id number]  HPI Patient seen for physical exam.  Generally very healthy.  He does not take any regular medications.  Non-smoker.  Does walk and play some golf but no other consistent exercise.  Needs repeat colonoscopy later this year.  Tetanus due 2024.  It appears he got 1 Shingrix vaccine back in 2019 but not the second.  He will check into insurance coverage for that.  Past Medical History:  Diagnosis Date  . Acute diverticulitis 06/15/2017   w/abscess/notes 06/16/2017  . Asthma   . Pneumonia 1980s X 1  . Seasonal allergies   . Stomach ulcer    "flares up once in a blue moon; last flareup was in ~ 2018" (06/16/2017)   Past Surgical History:  Procedure Laterality Date  . 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 KNEE ARTHROSCOPY Right 12/2016   MCL; ACL    reports that he has never smoked. He has never used smokeless tobacco. He reports current alcohol use. He reports that he does not use drugs. family history includes Cancer (age of onset: 7060) in his mother; Heart disease in his maternal uncle; Heart disease (age of onset: 6674) in his mother. No Known Allergies   Review of Systems  Constitutional: Negative for activity change, appetite change, fatigue and fever.  HENT: Negative for congestion, ear pain and trouble swallowing.   Eyes: Negative for pain and visual disturbance.  Respiratory: Negative for cough, shortness of breath and wheezing.   Cardiovascular: Negative for chest pain and palpitations.  Gastrointestinal: Negative for abdominal distention, abdominal pain, blood in stool, constipation, diarrhea, nausea, rectal pain and vomiting.  Genitourinary: Negative for dysuria, hematuria and testicular pain.  Musculoskeletal: Negative for arthralgias and joint swelling.  Skin: Negative for rash.  Neurological: Negative for dizziness, syncope and headaches.  Hematological: Negative  for adenopathy.  Psychiatric/Behavioral: Negative for confusion and dysphoric mood.       Objective:   Physical Exam Constitutional:      General: He is not in acute distress.    Appearance: He is well-developed.  HENT:     Head: Normocephalic and atraumatic.     Right Ear: External ear normal.     Left Ear: External ear normal.  Eyes:     Conjunctiva/sclera: Conjunctivae normal.     Pupils: Pupils are equal, round, and reactive to light.  Neck:     Musculoskeletal: Normal range of motion and neck supple.     Thyroid: No thyromegaly.  Cardiovascular:     Rate and Rhythm: Normal rate and regular rhythm.     Heart sounds: Normal heart sounds. No murmur.  Pulmonary:     Effort: No respiratory distress.     Breath sounds: No wheezing or rales.  Abdominal:     General: Bowel sounds are normal. There is no distension.     Palpations: Abdomen is soft. There is no mass.     Tenderness: There is no abdominal tenderness. There is no guarding or rebound.  Musculoskeletal:     Right lower leg: No edema.     Left lower leg: No edema.  Lymphadenopathy:     Cervical: No cervical adenopathy.  Skin:    Findings: No rash.     Comments: Couple of seborrheic keratoses but no concerning lesions  Neurological:     Mental Status: He is alert and oriented to person,  place, and time.     Cranial Nerves: No cranial nerve deficit.     Deep Tendon Reflexes: Reflexes normal.        Assessment:     Physical exam.  We discussed the following health maintenance issues    Plan:     -He will check on coverage of shingles vaccine and let us know if interested -Recommend continued yearly flu vaccine -Obtain screening lab work.  Discussed pros and cons of PSA screening and he agrees to proceed -Repeat colonoscopy this year  Eulas Post MD [facility]

## 2018-09-29 NOTE — Patient Instructions (Signed)
Consider repeat colonoscopy by age 53  Consider Shingles vaccine (shingrix)- check on insurance coverage.

## 2019-05-21 ENCOUNTER — Encounter: Payer: Self-pay | Admitting: Family Medicine

## 2019-05-21 ENCOUNTER — Ambulatory Visit (INDEPENDENT_AMBULATORY_CARE_PROVIDER_SITE_OTHER): Payer: 59 | Admitting: Family Medicine

## 2019-05-21 ENCOUNTER — Telehealth: Payer: Self-pay

## 2019-05-21 ENCOUNTER — Other Ambulatory Visit: Payer: Self-pay

## 2019-05-21 ENCOUNTER — Ambulatory Visit: Payer: 59 | Admitting: Family Medicine

## 2019-05-21 VITALS — BP 120/90 | HR 86 | Temp 98.3°F | Wt 184.0 lb

## 2019-05-21 DIAGNOSIS — M7712 Lateral epicondylitis, left elbow: Secondary | ICD-10-CM | POA: Diagnosis not present

## 2019-05-21 MED ORDER — PREDNISONE 10 MG PO TABS: ORAL_TABLET | ORAL | 0 refills | Status: DC

## 2019-05-21 MED ORDER — MELOXICAM 7.5 MG PO TABS: 7.5000 mg | ORAL_TABLET | Freq: Every day | ORAL | 0 refills | Status: DC

## 2019-05-21 NOTE — Telephone Encounter (Signed)
This has been taking care of.

## 2019-05-21 NOTE — Progress Notes (Signed)
Subjective:    Patient ID: Adam Oconnell, male    DOB: 1965/04/15, 53 y.o.   MRN: [id number]  No chief complaint on file.   HPI Pt is a 54 yo male with pmh sig for asthma, h/o diverticular abscess, h/o peptic ulcer, and SBO who is followed by Dr. Caryl Never.  Pt seen today for ongoing issue of L elbow pain x 2 months.  Denies injury.  With movement pain is a 2/10 in side of L elbow and forearm proximal to elbow.  Pt endorses increased pain after cutting down trees in his yard.  Denies edema, erythema, numbness, tingling, h/o gout.   Pt tried advil, heat, OTC rubs, rest.     Past Medical History:  Diagnosis Date  . Acute diverticulitis 06/15/2017   w/abscess/notes 06/16/2017  . Asthma   . Pneumonia 1980s X 1  . Seasonal allergies   . Stomach ulcer    "flares up once in a blue moon; last flareup was in ~ 2018" (06/16/2017)    No Known Allergies  ROS General: Denies fever, chills, night sweats, changes in weight, changes in appetite HEENT: Denies headaches, ear pain, changes in vision, rhinorrhea, sore throat CV: Denies CP, palpitations, SOB, orthopnea Pulm: Denies SOB, cough, wheezing GI: Denies abdominal pain, nausea, vomiting, diarrhea, constipation GU: Denies dysuria, hematuria, frequency Msk: Denies muscle cramps, joint pains  +L elbow pain Neuro: Denies weakness, numbness, tingling Skin: Denies rashes, bruising Psych: Denies depression, anxiety, hallucinations      Objective:    Blood pressure 120/90, pulse 86, temperature 98.3 F (36.8 C), temperature source Temporal, weight 184 lb (83.5 kg), SpO2 97 %.   Gen. Pleasant, well-nourished, in no distress, normal affect   HEENT: [place]/AT, face symmetric, conjunctiva clear, no scleral icterus, PERRLA, EOMI, nares patent without drainage Lungs: no accessory muscle use Cardiovascular: RRR, no peripheral edema Musculoskeletal: TTP of L lateral epicondyle.  Discomfort in distal to [facility] fossa and lateral epicondyle with flexion of  forearm.  No deformities, no cyanosis or clubbing, normal tone Neuro:  A&Ox3, CN II-XII intact, normal gait.  Negative Tinnel's, Phalen's Skin:  Warm, no lesions/ rash, or tophi noted   Wt Readings from Last 3 Encounters:  09/29/18 176 lb 11.2 oz (80.2 kg)  03/24/18 183 lb 14.4 oz (83.4 kg)  12/22/17 181 lb (82.1 kg)    Lab Results  Component Value Date   WBC 5.4 09/29/2018   HGB 14.9 09/29/2018   HCT 45.4 09/29/2018   PLT 283.0 09/29/2018   GLUCOSE 96 09/29/2018   CHOL 211 (H) 09/29/2018   TRIG 198.0 (H) 09/29/2018   HDL 42.20 09/29/2018   LDLDIRECT 128.0 04/21/2014   LDLCALC 129 (H) 09/29/2018   ALT 26 09/29/2018   AST 19 09/29/2018   NA 139 09/29/2018   K 4.9 09/29/2018   CL 103 09/29/2018   CREATININE 1.05 09/29/2018   BUN 15 09/29/2018   CO2 28 09/29/2018   TSH 1.32 09/29/2018   PSA 1.24 09/29/2018    Assessment/Plan:  Lateral epicondylitis of left elbow  -discussed various causes such as overuse -ok to continue supportive care: ice, OTC rubs, support band -imaging not indicated at this time, however consider for continued symptoms -Prednisone taper -Advised to take Mobic with food. - Plan: meloxicam (MOBIC) 7.5 MG tablet, predniSONE (DELTASONE) 10 MG tablet  F/u with pcp prn  Abbe Amsterdam, MD

## 2019-05-21 NOTE — Patient Instructions (Signed)
Tennis Elbow  Tennis elbow (lateral epicondylitis) is inflammation of tendons in your outer forearm, near your elbow. Tendons are tissues that connect muscle to bone. When you have tennis elbow, inflammation affects the tendons that you use to bend your wrist and move your hand up. Inflammation occurs in the lower part of the upper arm bone (humerus), where the tendons connect to the bone (lateral epicondyle). Tennis elbow often affects people who play tennis, but anyone may get the condition from repeatedly extending the wrist or turning the forearm. What are the causes? This condition is usually caused by repeatedly extending the wrist, turning the forearm, and using the hands. It can result from sports or work that requires repetitive forearm movements. In some cases, it may be caused by a sudden injury. What increases the risk? You are more likely to develop tennis elbow if you play tennis or another racket sport. You also have a higher risk if you frequently use your hands for work. Besides people who play tennis, others at greater risk include:  Musicians.  Carpenters, painters, and plumbers.  Cooks.  Cashiers.  People who work in factories.  Construction workers.  Butchers.  People who use computers. What are the signs or symptoms? Symptoms of this condition include:  Pain and tenderness in the forearm and the outer part of the elbow. Pain may be felt only when using the arm, or it may be there all the time.  A burning feeling that starts in the elbow and spreads down the forearm.  A weak grip in the hand. How is this diagnosed? This condition may be diagnosed based on:  Your symptoms and medical history.  A physical exam.  X-rays.  MRI. How is this treated? Resting and icing your arm is often the first treatment. Your health care provider may also recommend:  Medicines to reduce pain and inflammation. These may be in the form of a pill, topical gels, or shots of a  steroid medicine (cortisone).  An elbow strap to reduce stress on the area.  Physical therapy. This may include massage or exercises.  An elbow brace to restrict the movements that cause symptoms. If these treatments do not help relieve your symptoms, your health care provider may recommend surgery to remove damaged muscle and reattach healthy muscle to bone. Follow these instructions at home: Activity  Rest your elbow and wrist and avoid activities that cause symptoms, as told by your health care provider.  Do physical therapy exercises as instructed.  If you lift an object, lift it with your palm facing up. This reduces stress on your elbow. Lifestyle  If your tennis elbow is caused by sports, check your equipment and make sure that: ? You are using it correctly. ? It is the best fit for you.  If your tennis elbow is caused by work or computer use, take frequent breaks to stretch your arm. Talk with your manager about ways to manage your condition at work. If you have a brace:  Wear the brace or strap as told by your health care provider. Remove it only as told by your health care provider.  Loosen the brace if your fingers tingle, become numb, or turn cold and blue.  Keep the brace clean.  If the brace is not waterproof, ask if you may remove it for bathing. If you must keep the brace on while bathing: ? Do not let it get wet. ? Cover it with a watertight covering when you take a   bath or a shower. General instructions   If directed, put ice on the painful area: ? Put ice in a plastic bag. ? Place a towel between your skin and the bag. ? Leave the ice on for 20 minutes, 2-3 times a day.  Take over-the-counter and prescription medicines only as told by your health care provider.  Keep all follow-up visits as told by your health care provider. This is important. Contact a health care provider if:  You have pain that gets worse or does not get better with  treatment.  You have numbness or weakness in your forearm, hand, or fingers. Summary  Tennis elbow (lateral epicondylitis) is inflammation of tendons in your outer forearm, near your elbow.  Common symptoms include pain and tenderness in your forearm and the outer part of your elbow.  This condition is usually caused by repeatedly extending your wrist, turning your forearm, and using your hands.  The first treatment is often resting and icing your arm to relieve symptoms. Further treatment may include taking medicine, getting physical therapy, wearing a brace or strap, or having surgery. This information is not intended to replace advice given to you by your health care provider. Make sure you discuss any questions you have with your health care provider. Document Revised: 11/21/2017 Document Reviewed: 12/10/2016 Elsevier Patient Education  2020 [employer]  Tennis Elbow Rehab Ask your health care provider which exercises are safe for you. Do exercises exactly as told by your health care provider and adjust them as directed. It is normal to feel mild stretching, pulling, tightness, or discomfort as you do these exercises. Stop right away if you feel sudden pain or your pain gets worse. Do not begin these exercises until told by your health care provider. Stretching and range-of-motion exercises These exercises warm up your muscles and joints and improve the movement and flexibility of your elbow. These exercises also help to relieve pain, numbness, and tingling. Wrist flexion, assisted  1. Straighten your left / right elbow in front of you with your palm facing down toward the floor. ? If told by your health care provider, bend your left / right elbow to a 90-degree angle (right angle) at your side. 2. With your other hand, gently push over the back of your left / right hand so your fingers point toward the floor (flexion). Stop when you feel a gentle stretch on the back of your  forearm. 3. Hold this position for __________ seconds. Repeat __________ times. Complete this exercise __________ times a day. Wrist extension, assisted  1. Straighten your left / right elbow in front of you with your palm facing up toward the ceiling. ? If told by your health care provider, bend your left / right elbow to a 90-degree angle (right angle) at your side. 2. With your other hand, gently pull your left / right hand and fingers toward the floor (extension). Stop when you feel a gentle stretch on the palm side of your forearm. 3. Hold this position for __________ seconds. Repeat __________ times. Complete this exercise __________ times a day. Assisted forearm rotation, supination 1. Sit or stand with your left / right elbow bent to a 90-degree angle (right angle) at your side. 2. Using your uninjured hand, turn (rotate) your left / right palm up toward the ceiling (supination) until you feel a gentle stretch along the inside of your forearm. 3. Hold this position for __________ seconds. Repeat __________ times. Complete this exercise __________ times a day.   Assisted forearm rotation, pronation 1. Sit or stand with your left / right elbow bent to a 90-degree angle (right angle) at your side. 2. Using your uninjured hand, rotate your left / right palm down toward the floor (pronation) until you feel a gentle stretch along the outside of your forearm. 3. Hold this position for __________ seconds. Repeat __________ times. Complete this exercise __________ times a day. Strengthening exercises These exercises build strength and endurance in your forearm and elbow. Endurance is the ability to use your muscles for a long time, even after they get tired. Radial deviation  1. Stand with a __________ weight or a hammer in your left / right hand. Or, sit while holding a rubber exercise band or tubing, with your left / right forearm supported on a table or countertop. ? If you are standing,  position your forearm so that your thumb is facing forward. If you are sitting, position your forearm so that the thumb is facing the ceiling. This is the neutral position. 2. Raise your hand upward in front of you so your thumb moves toward the ceiling (radial deviation), or pull up on the rubber tubing. Keep your forearm and elbow still while you move your wrist only. 3. Hold this position for __________ seconds. 4. Slowly return to the starting position. Repeat __________ times. Complete this exercise __________ times a day. Wrist extension, eccentric 1. Sit with your left / right forearm palm-down and supported on a table or other surface. Let your left / right wrist extend over the edge of the surface. 2. Hold a __________ weight or a piece of exercise band or tubing in your left / right hand. ? If using a rubber exercise band or tubing, hold the other end of the tubing with your other hand. 3. Use your uninjured hand to move your left / right hand up toward the ceiling. 4. Take your uninjured hand away and slowly return to the starting position using only your left / right hand. Lowering your arm under tension is called eccentric extension. Repeat __________ times. Complete this exercise __________ times a day. Wrist extension Do not do this exercise if it causes pain at the outside of your elbow. Only do this exercise once instructed by your health care provider. 1. Sit with your left / right forearm supported on a table or other surface and your palm turned down toward the floor. Let your left / right wrist extend over the edge of the surface. 2. Hold a __________ weight or a piece of rubber exercise band or tubing. ? If you are using a rubber exercise band or tubing, hold the band or tubing in place with your other hand to provide resistance. 3. Slowly bend your wrist so your hand moves up toward the ceiling (extension). Move only your wrist, keeping your forearm and elbow still. 4. Hold  this position for __________ seconds. 5. Slowly return to the starting position. Repeat __________ times. Complete this exercise __________ times a day. Forearm rotation, supination To do this exercise, you will need a lightweight hammer or rubber mallet. 1. Sit with your left / right forearm supported on a table or other surface. Bend your elbow to a 90-degree angle (right angle). Position your forearm so that your palm is facing down toward the floor, with your hand resting over the edge of the table. 2. Hold a hammer in your left / right hand. ? To make this exercise easier, hold the hammer near the head   of the hammer. ? To make this exercise harder, hold the hammer near the end of the handle. 3. Without moving your wrist or elbow, slowly rotate your forearm so your palm faces up toward the ceiling (supination). 4. Hold this position for __________ seconds. 5. Slowly return to the starting position. Repeat __________ times. Complete this exercise __________ times a day. Shoulder blade squeeze 1. Sit in a stable chair or stand with good posture. If you are sitting down, do not let your back touch the back of the chair. 2. Your arms should be at your sides with your elbows bent to a 90-degree angle (right angle). Position your forearms so that your thumbs are facing the ceiling (neutral position). 3. Without lifting your shoulders up, squeeze your shoulder blades tightly together. 4. Hold this position for __________ seconds. 5. Slowly release and return to the starting position. Repeat __________ times. Complete this exercise __________ times a day. This information is not intended to replace advice given to you by your health care provider. Make sure you discuss any questions you have with your health care provider. Document Revised: 06/18/2018 Document Reviewed: 04/21/2018 Elsevier Patient Education  2020 Elsevier Inc.  

## 2019-05-21 NOTE — Telephone Encounter (Signed)
Pt has to reschedule appt. Due to accidentally over booking. Called pt to schedule 1pm appt. With Dr.Banks ok by Dr.Banks.

## 2019-06-14 ENCOUNTER — Other Ambulatory Visit: Payer: Self-pay | Admitting: Family Medicine

## 2019-06-14 DIAGNOSIS — M7712 Lateral epicondylitis, left elbow: Secondary | ICD-10-CM

## 2019-06-14 NOTE — Telephone Encounter (Signed)
Forwarding to [facility]

## 2019-06-14 NOTE — Telephone Encounter (Signed)
Dr. Burchette pt  

## 2019-07-13 ENCOUNTER — Other Ambulatory Visit: Payer: Self-pay | Admitting: Family Medicine

## 2019-07-13 DIAGNOSIS — M7712 Lateral epicondylitis, left elbow: Secondary | ICD-10-CM

## 2019-07-27 ENCOUNTER — Telehealth (INDEPENDENT_AMBULATORY_CARE_PROVIDER_SITE_OTHER): Payer: 59 | Admitting: Family Medicine

## 2019-07-27 ENCOUNTER — Encounter: Payer: Self-pay | Admitting: Family Medicine

## 2019-07-27 DIAGNOSIS — J0100 Acute maxillary sinusitis, unspecified: Secondary | ICD-10-CM

## 2019-07-27 DIAGNOSIS — J4520 Mild intermittent asthma, uncomplicated: Secondary | ICD-10-CM

## 2019-07-27 MED ORDER — ALBUTEROL SULFATE HFA 108 (90 BASE) MCG/ACT IN AERS: 2.0000 | INHALATION_SPRAY | RESPIRATORY_TRACT | 1 refills | Status: DC | PRN

## 2019-07-27 MED ORDER — AMOXICILLIN-POT CLAVULANATE 875-125 MG PO TABS: 1.0000 | ORAL_TABLET | Freq: Two times a day (BID) | ORAL | 0 refills | Status: DC

## 2019-07-27 NOTE — Progress Notes (Signed)
Virtual Visit via Video Note  I connected with Adam Oconnell  on 07/27/19 at 10:40 AM EDT by a video enabled telemedicine application and verified that I am speaking with the correct person using two identifiers.  Location patient: home Location provider:work or home office Persons participating in the virtual visit: patient, provider, sister  I discussed the limitations of evaluation and management by telemedicine and the availability of in person appointments. The patient expressed understanding and agreed to proceed.   HPI:  Acute visit for sinus issues: -started a few weeks ago with a "cold" but now he feels developing a sinus infection -symptoms include nasal congestion, thick discolored sinus discharge, PND, cough, some maxillary sinus pain -denies fevers, SOB, CP, NVD, body aches, loss of taste and smell -has a history of asthma - does use albuterol when he gets resp illnesses for cough at night -Daughter is in the hospital at [facility] for other issues (renal transplant); otherwise he is not around  -reports is fully vaccinated for COVID19 vaccine -reports has tolerated Augmentin well in the past, no abx in the last 1 year per his report   -history of "numerous sinus infections" -reports starts as a cold and then turns into a sinus infection  ROS: See pertinent positives and negatives per HPI.  Past Medical History:  Diagnosis Date  . Acute diverticulitis 06/15/2017   w/abscess/notes 06/16/2017  . Asthma   . Pneumonia 1980s X 1  . Seasonal allergies   . Stomach ulcer    "flares up once in a blue moon; last flareup was in ~ 2018" (06/16/2017)    Past Surgical History:  Procedure Laterality Date  . 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 KNEE ARTHROSCOPY Right 12/2016   MCL; ACL    Family History  Problem Relation Age of Onset  . Cancer Mother 64       uterus  . Heart disease Mother 81       CAD-MI  . Heart disease Maternal Uncle     SOCIAL HX: see  hpi   Current Outpatient Medications:  .  albuterol (VENTOLIN HFA) 108 (90 Base) MCG/ACT inhaler, Inhale 2 puffs into the lungs every 4 (four) hours as needed for wheezing or shortness of breath., Disp: 8.5 g, Rfl: 1 .  meloxicam (MOBIC) 7.5 MG tablet, TAKE 1 TABLET BY MOUTH EVERY DAY, Disp: 30 tablet, Rfl: 0 .  amoxicillin-clavulanate (AUGMENTIN) 875-125 MG tablet, Take 1 tablet by mouth 2 (two) times daily., Disp: 20 tablet, Rfl: 0  EXAM:  VITALS per patient if applicable:  GENERAL: alert, oriented, appears well and in no acute distress  HEENT: atraumatic, conjunttiva clear, no obvious abnormalities on inspection of external nose and ears, notes some maxillary sinus discomfort on his self exam  NECK: normal movements of the head and neck  LUNGS: on inspection no signs of respiratory distress, breathing rate appears normal, no obvious gross SOB, gasping or wheezing  CV: no obvious cyanosis  MS: moves all visible extremities without noticeable abnormality  PSYCH/NEURO: pleasant and cooperative, no obvious depression or anxiety, speech and thought processing grossly intact  ASSESSMENT AND PLAN:  Discussed the following assessment and plan:  Acute maxillary sinusitis, recurrence not specified  Mild intermittent asthma, unspecified whether complicated  -we discussed possible serious and likely etiologies, options for evaluation and workup, limitations of telemedicine visit vs in person visit, treatment, treatment risks and precautions. Pt prefers to treat via telemedicine empirically rather then risking or undertaking an in  person visit at this moment. Symptoms suggest he has developed a sinusitis and may be having a mild exacerbation of asthma. He opted for empiric treatment with Augmentin 875 bid x 7-10 days. For the asthma it seems the albuterol is working for now and he needed albuterol refills - sent. Did discuss possibility of COVID as was not fully vaccinated at the onset of the  illness. At this point he opted against testing. Patient agrees to seek prompt in person care if worsening, new symptoms arise, or if is not improving with treatment.   I discussed the assessment and treatment plan with the patient. The patient was provided an opportunity to ask questions and all were answered. The patient agreed with the plan and demonstrated an understanding of the instructions.   The patient was advised to call back or seek an in-person evaluation if the symptoms worsen or if the condition fails to improve as anticipated.   Terressa Koyanagi, DO

## 2019-11-17 ENCOUNTER — Telehealth: Payer: Self-pay | Admitting: Family Medicine

## 2019-11-17 NOTE — Telephone Encounter (Signed)
Pt stated that him and Randa Evens have been playing phone tag and was trying to return her call.   Pt can be reached at [phone]

## 2019-11-17 NOTE — Telephone Encounter (Signed)
See pts sister's chart.

## 2020-01-26 ENCOUNTER — Telehealth (INDEPENDENT_AMBULATORY_CARE_PROVIDER_SITE_OTHER): Payer: 59 | Admitting: Family Medicine

## 2020-01-26 DIAGNOSIS — F4321 Adjustment disorder with depressed mood: Secondary | ICD-10-CM | POA: Diagnosis not present

## 2020-01-26 DIAGNOSIS — J019 Acute sinusitis, unspecified: Secondary | ICD-10-CM | POA: Diagnosis not present

## 2020-01-26 MED ORDER — AMOXICILLIN-POT CLAVULANATE 875-125 MG PO TABS: 1.0000 | ORAL_TABLET | Freq: Two times a day (BID) | ORAL | 0 refills | Status: DC

## 2020-01-26 MED ORDER — LORAZEPAM 0.5 MG PO TABS: 0.5000 mg | ORAL_TABLET | Freq: Three times a day (TID) | ORAL | 0 refills | Status: DC | PRN

## 2020-01-26 NOTE — Progress Notes (Signed)
Patient ID: Adam Oconnell, male   DOB: [birth year], 54 y.o.   MRN: [id number]  This visit type was conducted due to national recommendations for restrictions regarding the COVID-19 pandemic in an effort to limit this patient's exposure and mitigate transmission in our community.   Virtual Visit via Telephone Note  I connected with Adam Oconnell on 01/26/20 at  1:15 PM EST by telephone and verified that I am speaking with the correct person using two identifiers.   I discussed the limitations, risks, security and privacy concerns of performing an evaluation and management service by telephone and the availability of in person appointments. I also discussed with the patient that there may be a patient responsible charge related to this service. The patient expressed understanding and agreed to proceed.  Location patient: home Location provider: work or home office Participants present for the call: patient, provider Patient did not have a visit in the prior 7 days to address this/these issue(s).   History of Present Illness:  Adam Oconnell called with the following concerns  He relates 2-week history of sinus infection symptoms.  He has had some facial pain and greenish nasal discharge.  Symptoms are getting worse not better.  Occasional cough.  No fever.  Occasional headaches.  He tried over-the-counter medications such as over-the-counter nasal sprays without improvement.  He is dealing with tremendous grief with the fact that his 17 year old daughter who is studying to be a Publishing rights manager just died of complications of renal transplant surgery.  He has been grieving and having tremendous difficulty sleeping.  He is requesting something short-term for that.  Past Medical History:  Diagnosis Date  . Acute diverticulitis 06/15/2017   w/abscess/notes 06/16/2017  . Asthma   . Pneumonia 1980s X 1  . Seasonal allergies   . Stomach ulcer    "flares up once in a blue moon; last flareup was in ~  2018" (06/16/2017)   Past Surgical History:  Procedure Laterality Date  . 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 KNEE ARTHROSCOPY Right 12/2016   MCL; ACL    reports that he has never smoked. He has never used smokeless tobacco. He reports current alcohol use. He reports that he does not use drugs. family history includes Cancer (age of onset: 80) in his mother; Heart disease in his maternal uncle; Heart disease (age of onset: 82) in his mother. No Known Allergies    Observations/Objective: Patient sounds cheerful and well on the phone. I do not appreciate any SOB. Speech and thought processing are grossly intact. Patient reported vitals:  Assessment and Plan:  #1 probable acute sinusitis  -Given duration of symptoms and progression of symptoms start Augmentin 875 mg twice daily with food for 10 days -Follow-up for persistent or worsening symptoms  #2 grief reaction from passing away of his daughter  -Consider counseling -We agreed to very limited lorazepam 0.5 mg 1 nightly as needed for severe insomnia #20 with no refill  Follow Up Instructions:  -As above   99441 5-10 [id number] 11-20 [id number] 21-30 I did not refer this patient for an OV in the next 24 hours for this/these issue(s).  I discussed the assessment and treatment plan with the patient. The patient was provided an opportunity to ask questions and all were answered. The patient agreed with the plan and demonstrated an understanding of the instructions.   The patient was advised to call back or seek an in-person evaluation if the symptoms worsen or if the  condition fails to improve as anticipated.  I provided  16 minutes of non-face-to-face time during this encounter.   Evelena Peat, MD

## 2020-02-13 IMAGING — CT CT ABD-PELV W/ CM
2 of 5 series · 16 of 46 positions shown, 18 images · IV contrast (iopamidol)
Comparison: None.

CLINICAL DATA: Left lower quadrant pain

EXAM:
CT ABDOMEN AND PELVIS WITH CONTRAST
TECHNIQUE: Multidetector CT imaging of the abdomen and pelvis was performed
using the standard protocol following bolus administration of
intravenous contrast.
CONTRAST:  100mL [id number] IOPAMIDOL ([id number]) INJECTION 61%

[Series 3: abdomen 5.0 · axial · 0.86mm/px · z∈[-500,-60]mm · 13 of 102 slices shown, 15 images]
[im 7/102  soft-tissue]
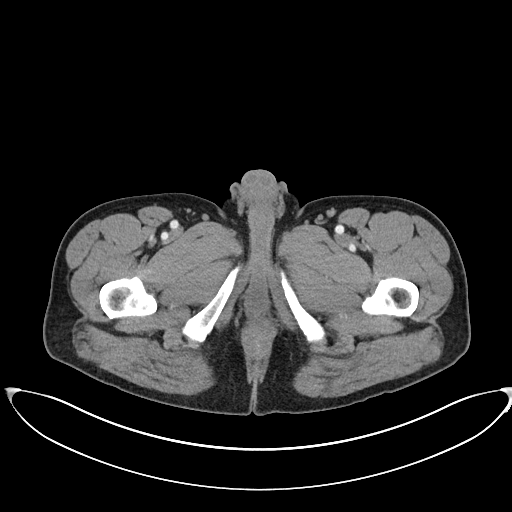
[im 7/102  bone]
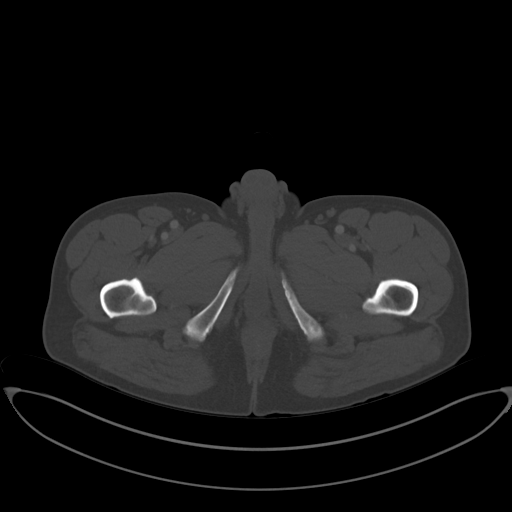
[im 13/102  soft-tissue]
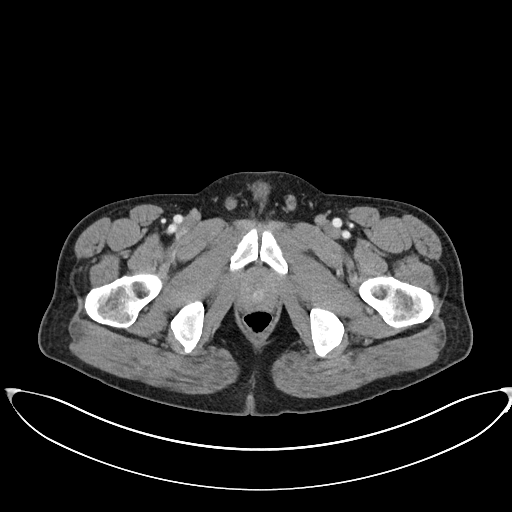
[im 19/102  soft-tissue]
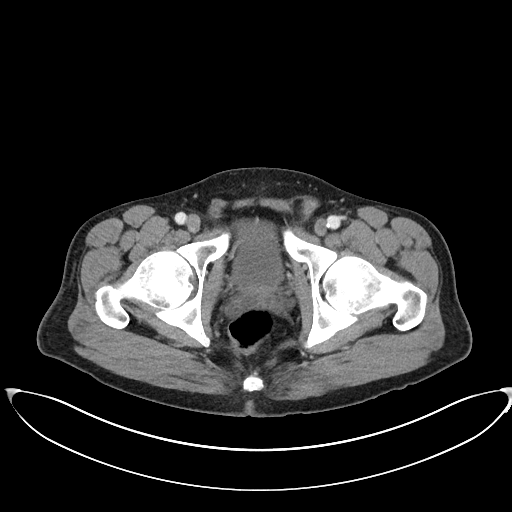
[im 32/102  soft-tissue]
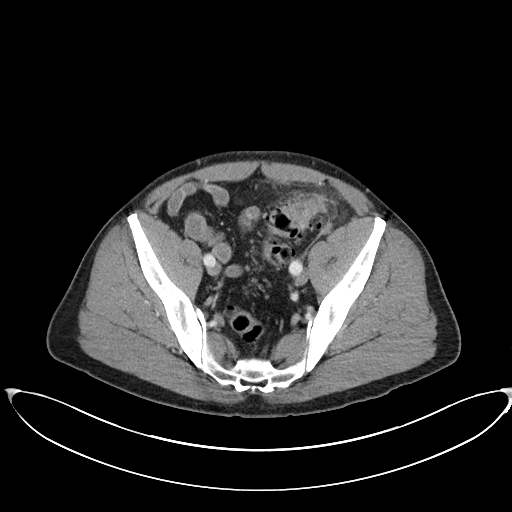
[im 38/102  soft-tissue]
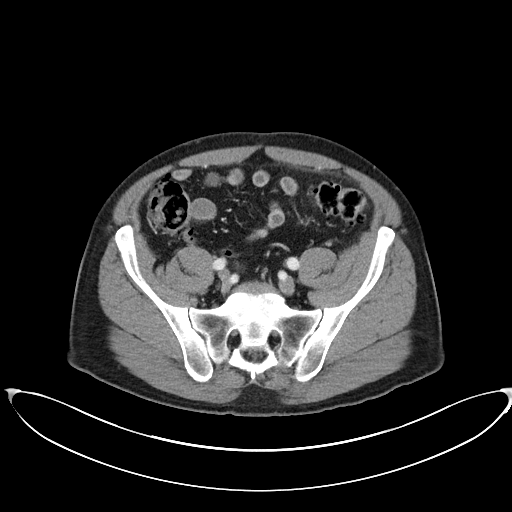
[im 45/102  soft-tissue]
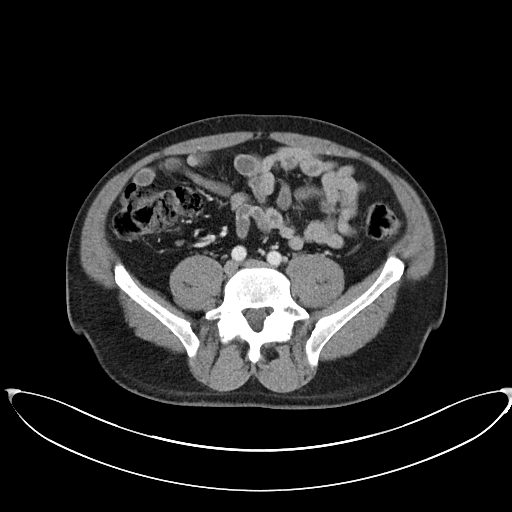
[im 51/102  soft-tissue]
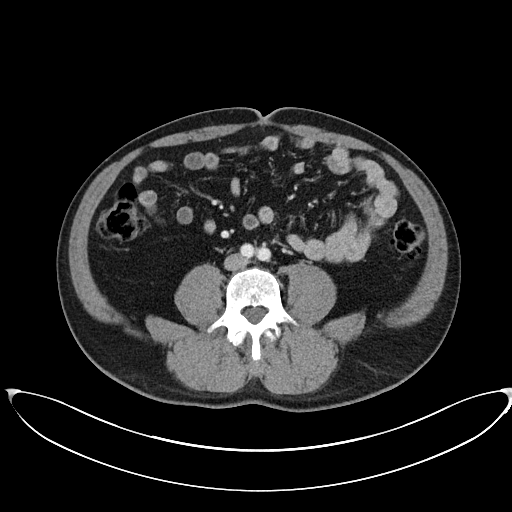
[im 57/102  soft-tissue]
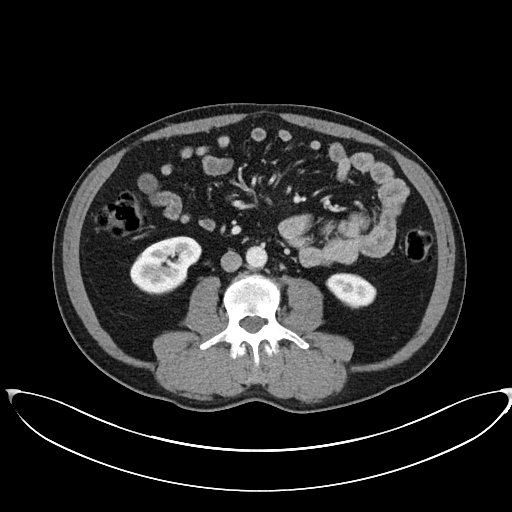
[im 64/102  soft-tissue]
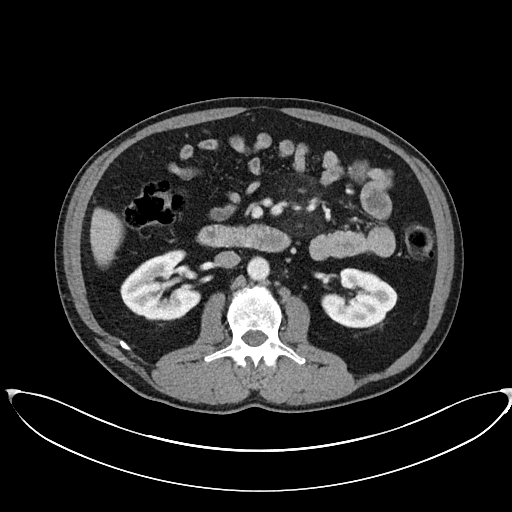
[im 64/102  bone]
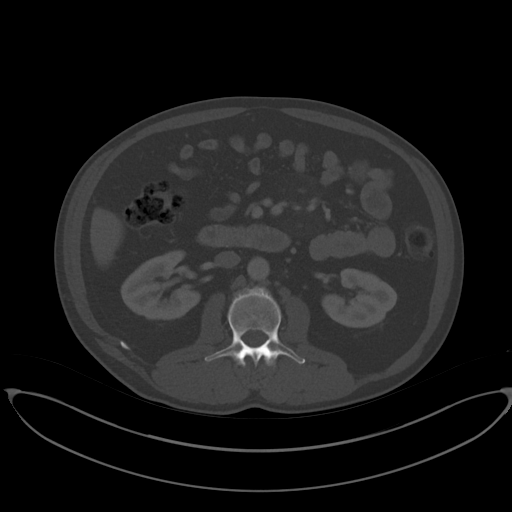
[im 70/102  soft-tissue]
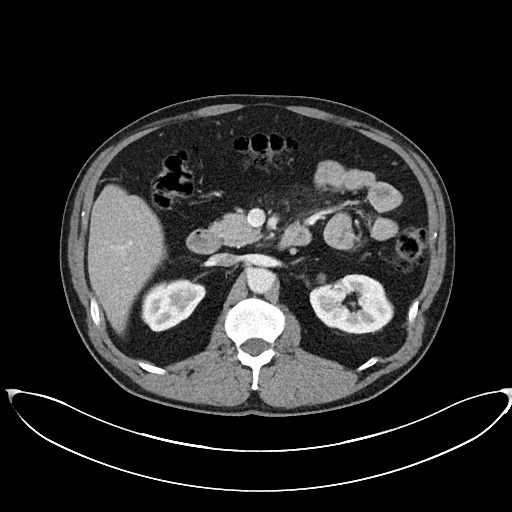
[im 83/102  soft-tissue]
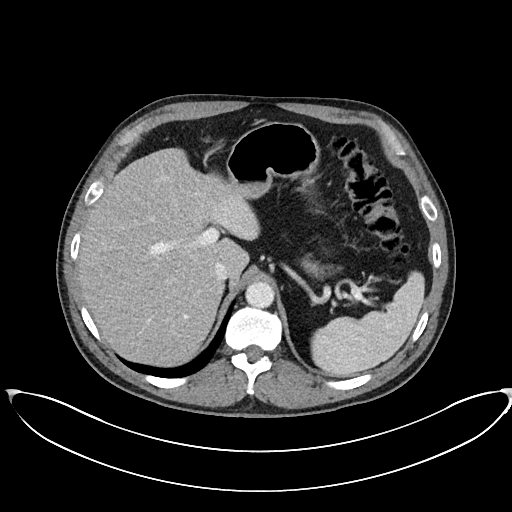
[im 89/102  soft-tissue]
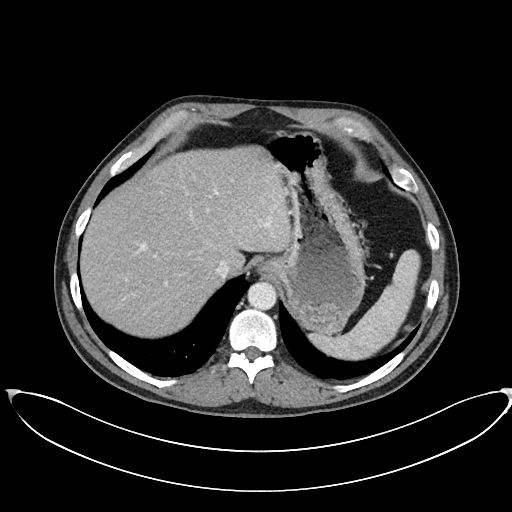
[im 95/102  soft-tissue]
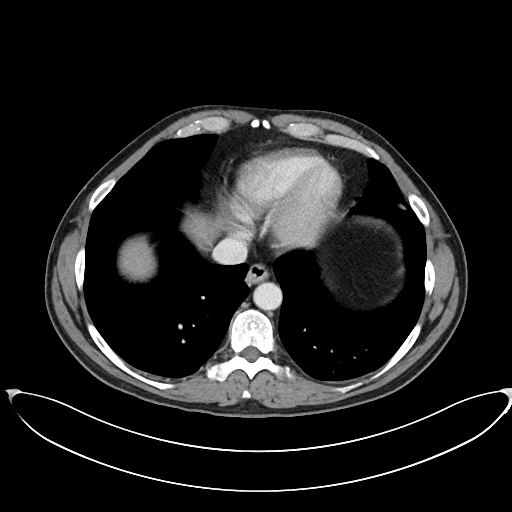

[Series 6: abdomen 3.0 mpr cor · coronal · 0.84mm/px · 3 of 87 slices shown]
[im 29/87  soft-tissue]
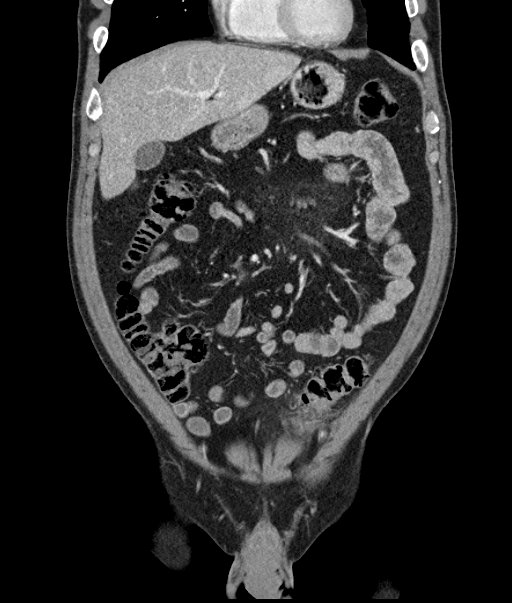
[im 39/87  soft-tissue]
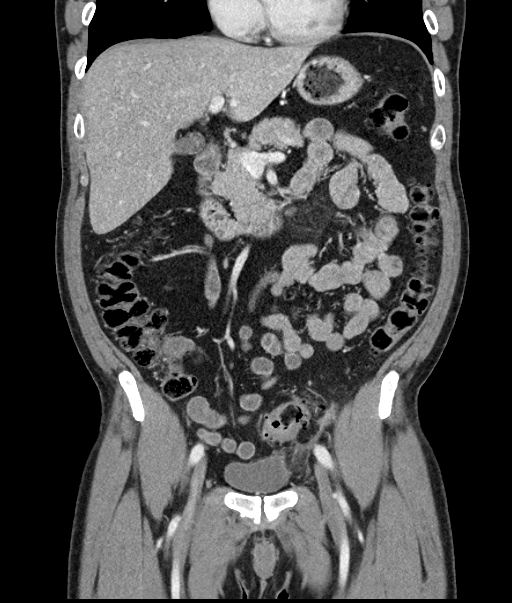
[im 48/87  soft-tissue]
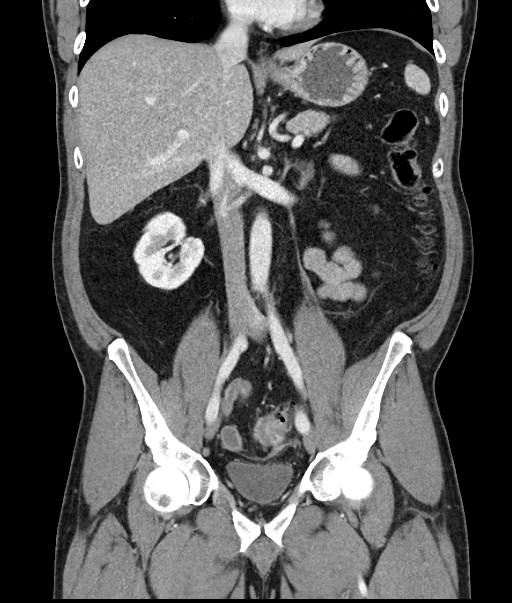

[16 of 46 positions shown; findings below may reference images not displayed]

FINDINGS: Lower chest: Unremarkable

Hepatobiliary: No focal abnormality within the liver parenchyma.
There is no evidence for gallstones, gallbladder wall thickening, or
pericholecystic fluid. No intrahepatic or extrahepatic biliary
dilation.

Pancreas: No focal mass lesion. No dilatation of the main duct. No
intraparenchymal cyst. No peripancreatic edema.

Spleen: No splenomegaly. No focal mass lesion.

Adrenals/Urinary Tract: No adrenal nodule or mass. 3 mm
nonobstructing stone identified in the upper pole of the right
kidney. Left kidney unremarkable. No evidence for hydroureter. The
urinary bladder appears normal for the degree of distention.

Stomach/Bowel: Stomach is nondistended. No gastric wall thickening.
No evidence of outlet obstruction. Duodenum is normally positioned
as is the ligament of Treitz. No small bowel wall thickening. No
small bowel dilatation. The terminal ileum is normal. The appendix
is normal. Diverticular changes are noted in the left colon. There
is a segment of wall thickening in the proximal sigmoid segment with
pericolonic edema/inflammation and a small amount of fluid in the
root of the sigmoid mesocolon. Imaging features compatible with
acute diverticulitis. No evidence for perforation. Sagittal imaging
demonstrates a tiny 1.4 cm rim enhancing collection adjacent to this
area of abnormal colon, compatible with a tiny abscess.

Vascular/Lymphatic: No abdominal aortic aneurysm. There is no
gastrohepatic or hepatoduodenal ligament lymphadenopathy. No
intraperitoneal or retroperitoneal lymphadenopathy. No pelvic
sidewall lymphadenopathy.

Reproductive: Prostate gland upper normal for size.

Other: No intraperitoneal free fluid.

Musculoskeletal: Bone windows reveal no worrisome lytic or sclerotic
osseous lesions.
IMPRESSION: 1. Left colonic diverticulosis with a short segment of sigmoid colon
demonstrating wall thickening and pericolonic edema/inflammation.
Imaging features consistent with acute diverticulitis. No evidence
for perforation or percutaneously drainable abscess although there
is a tiny 14 mm abscess in the fat adjacent to the inflamed colon.
2. Nonobstructing 3 mm right renal stone.

## 2020-07-07 ENCOUNTER — Other Ambulatory Visit: Payer: Self-pay

## 2020-07-07 ENCOUNTER — Ambulatory Visit (HOSPITAL_COMMUNITY)
Admission: RE | Admit: 2020-07-07 | Discharge: 2020-07-07 | Disposition: A | Discharge status: Home / Self Care | Payer: 59 | Source: Ambulatory Visit | Attending: Urgent Care | Admitting: Urgent Care

## 2020-07-07 ENCOUNTER — Encounter (HOSPITAL_COMMUNITY): Payer: Self-pay

## 2020-07-07 VITALS — BP 137/89 | HR 89 | Temp 97.9°F | Resp 16

## 2020-07-07 DIAGNOSIS — R52 Pain, unspecified: Secondary | ICD-10-CM | POA: Diagnosis not present

## 2020-07-07 DIAGNOSIS — R5383 Other fatigue: Secondary | ICD-10-CM

## 2020-07-07 DIAGNOSIS — S70362A Insect bite (nonvenomous), left thigh, initial encounter: Secondary | ICD-10-CM

## 2020-07-07 DIAGNOSIS — R5381 Other malaise: Secondary | ICD-10-CM

## 2020-07-07 DIAGNOSIS — R6883 Chills (without fever): Secondary | ICD-10-CM | POA: Diagnosis not present

## 2020-07-07 DIAGNOSIS — R202 Paresthesia of skin: Secondary | ICD-10-CM

## 2020-07-07 DIAGNOSIS — W57XXXA Bitten or stung by nonvenomous insect and other nonvenomous arthropods, initial encounter: Secondary | ICD-10-CM

## 2020-07-07 LAB — CBC
HCT: 45 % (ref 39.0–52.0)
Hemoglobin: 15.5 g/dL (ref 13.0–17.0)
MCH: 29.6 pg (ref 26.0–34.0)
MCHC: 34.4 g/dL (ref 30.0–36.0)
MCV: 85.9 fL (ref 80.0–100.0)
Platelets: 281 10*3/uL (ref 150–400)
RBC: 5.24 MIL/uL (ref 4.22–5.81)
RDW: 13.2 % (ref 11.5–15.5)
WBC: 6.8 10*3/uL (ref 4.0–10.5)
nRBC: 0 % (ref 0.0–0.2)

## 2020-07-07 LAB — BASIC METABOLIC PANEL
Anion gap: 8 (ref 5–15)
BUN: 14 mg/dL (ref 6–20)
CO2: 24 mmol/L (ref 22–32)
Calcium: 9 mg/dL (ref 8.9–10.3)
Chloride: 104 mmol/L (ref 98–111)
Creatinine, Ser: 1.04 mg/dL (ref 0.61–1.24)
GFR, Estimated: >60 mL/min (ref >60)
Glucose, Bld: 148 mg/dL — ABNORMAL HIGH (ref 70–99)
Potassium: 3.7 mmol/L (ref 3.5–5.1)
Sodium: 136 mmol/L (ref 135–145)

## 2020-07-07 MED ORDER — DOXYCYCLINE HYCLATE 100 MG PO CAPS: 200.0000 mg | ORAL_CAPSULE | Freq: Once | ORAL | 0 refills | Status: AC

## 2020-07-07 NOTE — ED Provider Notes (Signed)
[facility]   MRN: [id number] DOB: 1965-04-13  Subjective:   Adam Oconnell is a 55 y.o. male presenting for 3 day history of acute onset malaise, fatigue, chills, fever (highest temp was 100.68F), slight cough, chest tightness. He became more concerned because in the past day has had tingling of his legs and he had a tick bite that he removed on Sunday. Believes it was there for less than 24 hours, was not engorged.  Denies headache, confusion, shortness of breath, n/v, abdominal pain. Tick bite wound is resolving. Does not want to be tested for COVID 19, influenza.   Denies taking chronic medications.    No Known Allergies  Past Medical History:  Diagnosis Date  . Acute diverticulitis 06/15/2017   w/abscess/notes 06/16/2017  . Asthma   . Pneumonia 1980s X 1  . Seasonal allergies   . Stomach ulcer    "flares up once in a blue moon; last flareup was in ~ 2018" (06/16/2017)     Past Surgical History:  Procedure Laterality Date  . 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 KNEE ARTHROSCOPY Right 12/2016   MCL; ACL    Family History  Problem Relation Age of Onset  . Cancer Mother 10       uterus  . Heart disease Mother 82       CAD-MI  . Heart disease Maternal Uncle     Social History   Tobacco Use  . Smoking status: Never Smoker  . Smokeless tobacco: Never Used  Vaping Use  . Vaping Use: Never used  Substance Use Topics  . Alcohol use: Yes    Comment: 06/16/2017 "couple drinks q couple weeks"  . Drug use: No    ROS   Objective:   Vitals: BP 137/89 (BP Location: Right Arm)   Pulse 89   Temp 97.9 F (36.6 C) (Oral)   Resp 16   SpO2 96%   Physical Exam Constitutional:      General: He is not in acute distress.    Appearance: Normal appearance. He is well-developed and normal weight. He is not ill-appearing, toxic-appearing or diaphoretic.  HENT:     Head: Normocephalic and atraumatic.     Right Ear: Tympanic membrane, ear  canal and external ear normal. There is no impacted cerumen.     Left Ear: Tympanic membrane, ear canal and external ear normal. There is no impacted cerumen.     Nose: Nose normal. No congestion or rhinorrhea.     Mouth/Throat:     Mouth: Mucous membranes are moist.     Pharynx: Oropharynx is clear. No oropharyngeal exudate or posterior oropharyngeal erythema.  Eyes:     General: No scleral icterus.       Right eye: No discharge.        Left eye: No discharge.     Extraocular Movements: Extraocular movements intact.     Conjunctiva/sclera: Conjunctivae normal.     Pupils: Pupils are equal, round, and reactive to light.  Cardiovascular:     Rate and Rhythm: Normal rate and regular rhythm.     Heart sounds: Normal heart sounds. No murmur heard. No friction rub. No gallop.   Pulmonary:     Effort: Pulmonary effort is normal. No respiratory distress.     Breath sounds: Normal breath sounds. No stridor. No wheezing, rhonchi or rales.  Musculoskeletal:     Cervical back: Normal range of motion and neck supple. No rigidity.  No muscular tenderness.  Skin:    Findings: No rash.       Neurological:     General: No focal deficit present.     Mental Status: He is alert and oriented to person, place, and time.     Cranial Nerves: No cranial nerve deficit.     Sensory: No sensory deficit.     Motor: No weakness.     Coordination: Coordination normal.     Gait: Gait normal.     Deep Tendon Reflexes: Reflexes normal.  Psychiatric:        Mood and Affect: Mood is anxious. Mood is not depressed.        Speech: Speech normal.        Behavior: Behavior normal. Behavior is not agitated.        Thought Content: Thought content normal.        Cognition and Memory: Cognition is not impaired. Memory is not impaired.     Assessment and Plan :   PDMP not reviewed this encounter.  1. Malaise and fatigue   2. Body aches   3. Tingling   4. Chills   5. Tick bite of left thigh, initial encounter      Will use tick bite prophylaxis at a recommended dose of doxycycline 200mg  once. Labs pending. Counseled patient on potential for adverse effects with medications prescribed/recommended today, ER and return-to-clinic precautions discussed, patient verbalized understanding.    , Wallis Bamberg 07/07/20 1523

## 2020-07-07 NOTE — ED Triage Notes (Signed)
Patient presents to Urgent Care with complaints of fatigue, headache, and body aches since weds. Pt states he was bit by a tick Monday and concerned possible related to tick bite. He is also c/o of tingling sensation bilateral lower extremities.   Denies fever.

## 2020-07-10 LAB — ROCKY MTN SPOTTED FVR ABS PNL(IGG+IGM)
RMSF IgG: NEGATIVE
RMSF IgM: 0.32 index (ref 0.00–0.89)

## 2020-07-11 LAB — EHRLICHIA ANTIBODY PANEL
E chaffeensis (HGE) Ab, IgG: NEGATIVE
E chaffeensis (HGE) Ab, IgM: NEGATIVE
E. Chaffeensis (HME) IgM Titer: NEGATIVE
E.Chaffeensis (HME) IgG: NEGATIVE

## 2020-07-18 LAB — MISC LABCORP TEST (SEND OUT): Labcorp test code: 164226

## 2020-07-18 LAB — B. BURGDORFI ANTIBODIES

## 2020-07-19 ENCOUNTER — Ambulatory Visit (INDEPENDENT_AMBULATORY_CARE_PROVIDER_SITE_OTHER): Payer: 59 | Admitting: Family Medicine

## 2020-07-19 ENCOUNTER — Other Ambulatory Visit: Payer: Self-pay

## 2020-07-19 ENCOUNTER — Encounter: Payer: Self-pay | Admitting: Family Medicine

## 2020-07-19 VITALS — BP 118/64 | HR 66 | Temp 97.8°F | Ht 68.0 in | Wt 178.4 lb

## 2020-07-19 DIAGNOSIS — Z0000 Encounter for general adult medical examination without abnormal findings: Secondary | ICD-10-CM

## 2020-07-19 LAB — LIPID PANEL
Cholesterol: 211 mg/dL — ABNORMAL HIGH (ref 0–200)
HDL: 44 mg/dL (ref >39.00)
LDL Cholesterol: 134 mg/dL — ABNORMAL HIGH (ref 0–99)
NonHDL: 167.41
Total CHOL/HDL Ratio: 5
Triglycerides: 166 mg/dL — ABNORMAL HIGH (ref 0.0–149.0)
VLDL: 33.2 mg/dL (ref 0.0–40.0)

## 2020-07-19 LAB — HEPATIC FUNCTION PANEL
ALT: 19 U/L (ref 0–53)
AST: 16 U/L (ref 0–37)
Albumin: 4.6 g/dL (ref 3.5–5.2)
Alkaline Phosphatase: 53 U/L (ref 39–117)
Bilirubin, Direct: 0.1 mg/dL (ref 0.0–0.3)
Total Bilirubin: 0.7 mg/dL (ref 0.2–1.2)
Total Protein: 7.4 g/dL (ref 6.0–8.3)

## 2020-07-19 LAB — PSA: PSA: 1.54 ng/mL (ref 0.10–4.00)

## 2020-07-19 LAB — BASIC METABOLIC PANEL
BUN: 16 mg/dL (ref 6–23)
CO2: 28 mEq/L (ref 19–32)
Calcium: 9.3 mg/dL (ref 8.4–10.5)
Chloride: 103 mEq/L (ref 96–112)
Creatinine, Ser: 1.02 mg/dL (ref 0.40–1.50)
GFR: 82.19 mL/min (ref >60.00)
Glucose, Bld: 91 mg/dL (ref 70–99)
Potassium: 4.4 mEq/L (ref 3.5–5.1)
Sodium: 139 mEq/L (ref 135–145)

## 2020-07-19 LAB — CBC WITH DIFFERENTIAL/PLATELET
Basophils Absolute: 0 10*3/uL (ref 0.0–0.1)
Basophils Relative: 0.5 % (ref 0.0–3.0)
Eosinophils Absolute: 0.2 10*3/uL (ref 0.0–0.7)
Eosinophils Relative: 3.2 % (ref 0.0–5.0)
HCT: 43.9 % (ref 39.0–52.0)
Hemoglobin: 14.8 g/dL (ref 13.0–17.0)
Lymphocytes Relative: 38.4 % (ref 12.0–46.0)
Lymphs Abs: 2.6 10*3/uL (ref 0.7–4.0)
MCHC: 33.8 g/dL (ref 30.0–36.0)
MCV: 86.7 fl (ref 78.0–100.0)
Monocytes Absolute: 0.4 10*3/uL (ref 0.1–1.0)
Monocytes Relative: 6.5 % (ref 3.0–12.0)
Neutro Abs: 3.5 10*3/uL (ref 1.4–7.7)
Neutrophils Relative %: 51.4 % (ref 43.0–77.0)
Platelets: 320 10*3/uL (ref 150.0–400.0)
RBC: 5.06 Mil/uL (ref 4.22–5.81)
RDW: 13.8 % (ref 11.5–15.5)
WBC: 6.9 10*3/uL (ref 4.0–10.5)

## 2020-07-19 LAB — TSH: TSH: 1.74 u[IU]/mL (ref 0.35–4.50)

## 2020-07-19 NOTE — Progress Notes (Addendum)
Established Patient Office Visit  Subjective:  Patient ID: Adam Oconnell, male    DOB: December 24, 1965  Age: 55 y.o. MRN: [id number]  CC:  Chief Complaint  Patient presents with  . Annual Exam    No new concerns    HPI Adam Oconnell presents for annual physical exam.  He is generally very healthy.  Takes no regular medications.  He and his family continue to grieve the loss of their daughter who died this past 2023-02-11 at age 1 related to complications from kidney surgery.  He has not been exercising as much this past year because of this.  He does feel like things are gradually improving slowly.  Maintenance reviewed  -Colonoscopy due 2031 -Tetanus due 2024 -COVID vaccines complete  Social history-married for 33 years.  He does have 83-month-old grandson and also has a son.  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  Rare alcohol.  Never smoked.  Family history-mother had history of uterine cancer.  She had coronary disease with first MI in her mid 30s and second around age 18.  Father's history is not much known.  He did have history of alcoholism.  He has 1 sister he has history of mental retardation and he serves as guardian for her.  He has 4 brothers.  One died of motor vehicle accident and other 3 are alive and well.  The 10-year ASCVD risk score Denman George DC Montez Hageman., et al., 2013) is: 6.3%   Values used to calculate the score:     Age: 20 years     Sex: Male     Is Non-Hispanic African American: No     Diabetic: No     Tobacco smoker: No     Systolic Blood Pressure: 118 mmHg     Is BP treated: No     HDL Cholesterol: 44 mg/dL     Total Cholesterol: 211 mg/dL  Past Medical History:  Diagnosis Date  . Acute diverticulitis 06/15/2017   w/abscess/notes 06/16/2017  . Asthma   . Pneumonia 1980s X 1  . Seasonal allergies   . Stomach ulcer    "flares up once in a blue moon; last flareup was in ~ 2018" (06/16/2017)    Past Surgical History:  Procedure Laterality Date  .  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 KNEE ARTHROSCOPY Right 12/2016   MCL; ACL    Family History  Problem Relation Age of Onset  . Cancer Mother 45       uterus  . Heart disease Mother 80       CAD-MI  . Heart disease Maternal Uncle   . Alcohol abuse Father   . Diabetes Brother     Social History   Socioeconomic History  . Marital status: Married    Spouse name: Not on file  . Number of children: Not on file  . Years of education: Not on file  . Highest education level: Not on file  Occupational History  . Not on file  Tobacco Use  . Smoking status: Never Smoker  . Smokeless tobacco: Never Used  Vaping Use  . Vaping Use: Never used  Substance and Sexual Activity  . Alcohol use: Yes    Comment: 06/16/2017 "couple drinks q couple weeks"  . Drug use: No  . Sexual activity: Yes  Other Topics Concern  . Not on file  Social History Narrative  . Not on file   Social Determinants of Health  Financial Resource Strain: Not on file  Food Insecurity: Not on file  Transportation Needs: Not on file  Physical Activity: Not on file  Stress: Not on file  Social Connections: Not on file  Intimate Partner Violence: Not on file    Outpatient Medications Prior to Visit  Medication Sig Dispense Refill  . LORazepam (ATIVAN) 0.5 MG tablet Take 1 tablet (0.5 mg total) by mouth every 8 (eight) hours as needed for anxiety. 20 tablet 0   No facility-administered medications prior to visit.    No Known Allergies  ROS Review of Systems  Constitutional: Negative for activity change, appetite change, fatigue and fever.  HENT: Negative for congestion, ear pain and trouble swallowing.   Eyes: Negative for pain and visual disturbance.  Respiratory: Negative for cough, shortness of breath and wheezing.   Cardiovascular: Negative for chest pain and palpitations.  Gastrointestinal: Negative for abdominal distention, abdominal pain, blood in stool, constipation, diarrhea,  nausea, rectal pain and vomiting.  Genitourinary: Negative for dysuria, hematuria and testicular pain.  Musculoskeletal: Negative for arthralgias and joint swelling.  Skin: Negative for rash.  Neurological: Negative for dizziness, syncope and headaches.  Hematological: Negative for adenopathy.  Psychiatric/Behavioral: Positive for dysphoric mood. Negative for confusion.      Objective:    Physical Exam Constitutional:      General: He is not in acute distress.    Appearance: He is well-developed.  HENT:     Head: Normocephalic and atraumatic.     Right Ear: External ear normal.     Left Ear: External ear normal.  Eyes:     Conjunctiva/sclera: Conjunctivae normal.     Pupils: Pupils are equal, round, and reactive to light.  Neck:     Thyroid: No thyromegaly.  Cardiovascular:     Rate and Rhythm: Normal rate and regular rhythm.     Heart sounds: Normal heart sounds. No murmur heard.   Pulmonary:     Effort: No respiratory distress.     Breath sounds: No wheezing or rales.  Abdominal:     General: Bowel sounds are normal. There is no distension.     Palpations: Abdomen is soft. There is no mass.     Tenderness: There is no abdominal tenderness. There is no guarding or rebound.  Musculoskeletal:     Cervical back: Normal range of motion and neck supple.     Right lower leg: No edema.     Left lower leg: No edema.  Lymphadenopathy:     Cervical: No cervical adenopathy.  Skin:    Findings: No rash.  Neurological:     Mental Status: He is alert and oriented to person, place, and time.     Cranial Nerves: No cranial nerve deficit.     BP 118/64 (BP Location: Left Arm, Patient Position: Sitting, Cuff Size: Normal)   Pulse 66   Temp 97.8 F (36.6 C) (Oral)   Ht 5\' 8"  (1.727 m)   Wt 178 lb 6.4 oz (80.9 kg)   SpO2 98%   BMI 27.13 kg/m  Wt Readings from Last 3 Encounters:  07/19/20 178 lb 6.4 oz (80.9 kg)  05/21/19 184 lb (83.5 kg)  09/29/18 176 lb 11.2 oz (80.2 kg)      Health Maintenance Due  Topic Date Due  . COVID-19 Vaccine (3 - Booster for Pfizer series) 01/19/2020    There are no preventive care reminders to display for this patient.  Lab Results  Component Value Date   TSH  1.32 09/29/2018   Lab Results  Component Value Date   WBC 6.8 07/07/2020   HGB 15.5 07/07/2020   HCT 45.0 07/07/2020   MCV 85.9 07/07/2020   PLT 281 07/07/2020   Lab Results  Component Value Date   NA 136 07/07/2020   K 3.7 07/07/2020   CO2 24 07/07/2020   GLUCOSE 148 (H) 07/07/2020   BUN 14 07/07/2020   CREATININE 1.04 07/07/2020   BILITOT 0.5 09/29/2018   ALKPHOS 51 09/29/2018   AST 19 09/29/2018   ALT 26 09/29/2018   PROT 7.0 09/29/2018   ALBUMIN 4.5 09/29/2018   CALCIUM 9.0 07/07/2020   ANIONGAP 8 07/07/2020   GFR 73.43 09/29/2018   Lab Results  Component Value Date   CHOL 211 (H) 09/29/2018   Lab Results  Component Value Date   HDL 42.20 09/29/2018   Lab Results  Component Value Date   LDLCALC 129 (H) 09/29/2018   Lab Results  Component Value Date   TRIG 198.0 (H) 09/29/2018   Lab Results  Component Value Date   CHOLHDL 5 09/29/2018   No results found for: HGBA1C    Assessment & Plan:   Problem List Items Addressed This Visit   None   Visit Diagnoses    Physical exam    -  Primary   Relevant Orders   Basic metabolic panel   Lipid panel   CBC with Differential/Platelet   TSH   Hepatic function panel   PSA    He continues to grieve regarding loss of daughter.  He does not feel need for medication at this point. -We recommend try to establish more consistent exercise -Obtain screening labs including PSA -He had 1 shingles vaccine April 2019.  He is encouraged to follow-up to complete that series.  He will check with insurance coverage first  No orders of the defined types were placed in this encounter.   Follow-up: No follow-ups on file.    Evelena Peat, MD

## 2020-07-19 NOTE — Patient Instructions (Signed)

## 2020-09-12 ENCOUNTER — Other Ambulatory Visit: Payer: Self-pay

## 2020-09-13 ENCOUNTER — Encounter: Payer: Self-pay | Admitting: Family Medicine

## 2020-09-13 ENCOUNTER — Ambulatory Visit: Payer: 59 | Admitting: Family Medicine

## 2020-09-13 VITALS — BP 132/70 | HR 83 | Temp 97.7°F | Wt 179.4 lb

## 2020-09-13 DIAGNOSIS — N50811 Right testicular pain: Secondary | ICD-10-CM

## 2020-09-13 LAB — POCT URINALYSIS DIPSTICK
Bilirubin, UA: NEGATIVE
Blood, UA: NEGATIVE
Glucose, UA: NEGATIVE
Ketones, UA: NEGATIVE
Leukocytes, UA: NEGATIVE
Nitrite, UA: NEGATIVE
Protein, UA: NEGATIVE
Spec Grav, UA: 1.025 (ref 1.010–1.025)
Urobilinogen, UA: 0.2 E.U./dL
pH, UA: 6 (ref 5.0–8.0)

## 2020-09-13 NOTE — Progress Notes (Signed)
Established Patient Office Visit  Subjective:  Patient ID: Adam Oconnell, male    DOB: Feb 07, 1966  Age: 55 y.o. MRN: [id number]  CC:  Chief Complaint  Patient presents with   Testicle Pain    R side, x 2 months, occasional sharp pain, constant dull aching pain, no know injury, no urinary symptoms    HPI Adam Oconnell presents for testicle pain mostly right side.  He states for years even back to childhood he has had some testicle pain bilaterally.  Worse on the right side.  Started off just occasional and sharp pains on the right side but more dull and constant recently.  He has small cyst just above the left testis which has had in the past and is unchanged.  No dysuria.  No hematuria.  No appetite or weight changes.  He is not noted any masses or other concerns.  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   KNEE ARTHROSCOPY Right 12/2016   MCL; ACL    Family History  Problem Relation Age of Onset   Cancer Mother 57       uterus   Heart disease Mother 26       CAD-MI   Heart disease Maternal Uncle    Alcohol abuse Father    Diabetes Brother     Social History   Socioeconomic History   Marital status: Married    Spouse name: Not on file   Number of children: Not on file   Years of education: Not on file   Highest education level: Not on file  Occupational History   Not on file  Tobacco Use   Smoking status: Never   Smokeless tobacco: Never  Vaping Use   Vaping Use: Never used  Substance and Sexual Activity   Alcohol use: Yes    Comment: 06/16/2017 "couple drinks q couple weeks"   Drug use: No   Sexual activity:  Yes  Other Topics Concern   Not on file  Social History Narrative   Not on file   Social Determinants of Health   Financial Resource Strain: Not on file  Food Insecurity: Not on file  Transportation Needs: Not on file  Physical Activity: Not on file  Stress: Not on file  Social Connections: Not on file  Intimate Partner Violence: Not on file    No outpatient medications prior to visit.   No facility-administered medications prior to visit.    No Known Allergies  ROS Review of Systems  Constitutional:  Negative for chills and fever.  Genitourinary:  Positive for testicular pain. Negative for dysuria, genital sores, hematuria, penile swelling and scrotal swelling.     Objective:    Physical Exam Vitals reviewed.  Constitutional:      Appearance: Normal appearance.  Cardiovascular:     Rate and Rhythm: Normal rate and regular rhythm.  Genitourinary:    Comments: Testes are somewhat atrophic bilaterally.  No testicle masses.  He does have small rounded cystic type lesion just above the left testis and he states this is been constant  for years.  This is not attached to the testicle.  No hernia.  No epididymis tenderness on the right Neurological:     Mental Status: He is alert.    BP 132/70 (BP Location: Left Arm, Patient Position: Sitting, Cuff Size: Normal)   Pulse 83   Temp 97.7 F (36.5 C) (Oral)   Wt 179 lb 6.4 oz (81.4 kg)   SpO2 96%   BMI 27.28 kg/m  Wt Readings from Last 3 Encounters:  09/13/20 179 lb 6.4 oz (81.4 kg)  07/19/20 178 lb 6.4 oz (80.9 kg)  05/21/19 184 lb (83.5 kg)     Health Maintenance Due  Topic Date Due   Zoster Vaccines- Shingrix (2 of 2) 09/02/2017   COVID-19 Vaccine (3 - Booster for Pfizer series) 12/19/2019    There are no preventive care reminders to display for this patient.  Lab Results  Component Value Date   TSH 1.74 07/19/2020   Lab Results  Component Value Date   WBC 6.9 07/19/2020   HGB 14.8 07/19/2020   HCT  43.9 07/19/2020   MCV 86.7 07/19/2020   PLT 320.0 07/19/2020   Lab Results  Component Value Date   NA 139 07/19/2020   K 4.4 07/19/2020   CO2 28 07/19/2020   GLUCOSE 91 07/19/2020   BUN 16 07/19/2020   CREATININE 1.02 07/19/2020   BILITOT 0.7 07/19/2020   ALKPHOS 53 07/19/2020   AST 16 07/19/2020   ALT 19 07/19/2020   PROT 7.4 07/19/2020   ALBUMIN 4.6 07/19/2020   CALCIUM 9.3 07/19/2020   ANIONGAP 8 07/07/2020   GFR 82.19 07/19/2020   Lab Results  Component Value Date   CHOL 211 (H) 07/19/2020   Lab Results  Component Value Date   HDL 44.00 07/19/2020   Lab Results  Component Value Date   LDLCALC 134 (H) 07/19/2020   Lab Results  Component Value Date   TRIG 166.0 (H) 07/19/2020   Lab Results  Component Value Date   CHOLHDL 5 07/19/2020   No results found for: HGBA1C    Assessment & Plan:   Problem List Items Addressed This Visit   None Visit Diagnoses     Testicular pain, right    -  Primary   Relevant Orders   POCT urinalysis dipstick     He relates chronic pain.  Relatively normal exam except for some diffuse testicle tenderness.  No tenderness involving the epididymis gland.  -Check urine dipstick -Consider trial of Advil or Aleve.  If not getting relief with that consider urology referral.  No orders of the defined types were placed in this encounter.   Follow-up: No follow-ups on file.    Evelena Peat, MD

## 2020-09-13 NOTE — Patient Instructions (Signed)
Consider trial of over-the-counter Aleve or Advil twice daily with food for the next few days to see if this helps relieve the discomfort  Continue with good support briefs  If pain not relieved with Advil or Aleve be in touch.  We may wish to consider urology consultation at that point for trial of medication such as gabapentin

## 2020-11-08 ENCOUNTER — Telehealth (INDEPENDENT_AMBULATORY_CARE_PROVIDER_SITE_OTHER): Payer: 59 | Admitting: Family Medicine

## 2020-11-08 ENCOUNTER — Other Ambulatory Visit: Payer: Self-pay

## 2020-11-08 DIAGNOSIS — F5101 Primary insomnia: Secondary | ICD-10-CM

## 2020-11-08 DIAGNOSIS — F321 Major depressive disorder, single episode, moderate: Secondary | ICD-10-CM

## 2020-11-08 MED ORDER — ESCITALOPRAM OXALATE 10 MG PO TABS: 10.0000 mg | ORAL_TABLET | Freq: Every day | ORAL | 5 refills | Status: DC

## 2020-11-08 NOTE — Progress Notes (Signed)
Patient ID: Adam Oconnell, male   DOB: 07/29/1965, 55 y.o.   MRN: [id number]  This visit type was conducted due to national recommendations for restrictions regarding the COVID-19 pandemic in an effort to limit this patient's exposure and mitigate transmission in our community.   Virtual Visit via Video Note  I connected with Adam Oconnell on 11/08/20 at 10:00 AM EDT by a video enabled telemedicine application and verified that I am speaking with the correct person using two identifiers.  Location patient: home Location provider:work or home office Persons participating in the virtual visit: patient, provider  I discussed the limitations of evaluation and management by telemedicine and the availability of in person appointments. The patient expressed understanding and agreed to proceed.   HPI:  Adam Oconnell presents for depression symptoms.  His daughter passed away during the past year and he has had a very difficult year with several anniversaries of things like birthdays.  He has had tremendous difficulty sleeping.  Was waking up around 3 AM and now around 1 AM and difficulty getting back to sleep.  He has low motivation.  Some loss of interest in activities.  He has engaged in some activities but not at his baseline.  He tried multiple things for sleep including melatonin and Benadryl.  Melatonin helps slightly but Benadryl caused hangover drowsiness.  No suicidal ideation.   ROS: See pertinent positives and negatives per HPI.  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  No regular alcohol use.  No current outpatient medications on file.  EXAM:  VITALS per patient if applicable:  GENERAL: alert, oriented, appears well and in no acute distress  HEENT: atraumatic, conjunttiva clear, no obvious abnormalities on inspection of external nose and ears  NECK: normal movements of the head and neck  LUNGS: on inspection no signs of respiratory distress, breathing rate appears normal, no obvious gross SOB, gasping or wheezing  CV: no obvious cyanosis  MS: moves all visible extremities without noticeable abnormality  PSYCH/NEURO: pleasant and cooperative, no obvious depression or anxiety, speech and thought processing grossly intact  ASSESSMENT AND PLAN:  Discussed the following assessment and plan:  Increasing depression symptoms.  He also has significant insomnia which we suspect is related.  Loss of his daughter during the past year.  He has had some counseling.  -We discussed starting Lexapro 10 mg once daily -He is aware this may take several weeks to see full effect.  Can continue things like melatonin for sleep disturbance.  Try to avoid regular use of sedative hypnotics at this time. -Touch base for some feedback in 3 to 4 weeks.     I discussed the assessment and treatment plan with the  patient. The patient was provided an opportunity to ask questions and all were answered. The patient agreed with the plan and demonstrated an understanding of the instructions.   The patient was advised to call back or seek an in-person evaluation if the symptoms worsen or if the condition fails to improve as anticipated.     Carolann Littler, MD

## 2020-11-10 ENCOUNTER — Ambulatory Visit: Payer: 59 | Admitting: Family Medicine

## 2020-11-30 ENCOUNTER — Other Ambulatory Visit: Payer: Self-pay | Admitting: Family Medicine

## 2021-01-08 ENCOUNTER — Telehealth: Payer: Self-pay | Admitting: Family Medicine

## 2021-01-08 MED ORDER — ALBUTEROL SULFATE HFA 108 (90 BASE) MCG/ACT IN AERS: INHALATION_SPRAY | RESPIRATORY_TRACT | 0 refills | Status: DC

## 2021-01-08 NOTE — Telephone Encounter (Signed)
Patient called after hours line--stated he needs a refill on his Albuterol.

## 2021-01-08 NOTE — Telephone Encounter (Signed)
Rx sent in. Spoke with the patient and he is aware.  °

## 2021-01-08 NOTE — Telephone Encounter (Signed)
Please advise. This is not on the current med list. 

## 2021-02-13 ENCOUNTER — Other Ambulatory Visit: Payer: Self-pay | Admitting: Family Medicine

## 2021-02-13 NOTE — Telephone Encounter (Signed)
Telephone message from 01/08/21, was said to refill Albuterol once.  Please advise if okay for refill

## 2021-05-25 ENCOUNTER — Ambulatory Visit: Payer: 59 | Admitting: Family Medicine

## 2021-05-25 ENCOUNTER — Encounter: Payer: Self-pay | Admitting: Family Medicine

## 2021-05-25 VITALS — BP 120/86 | HR 91 | Temp 97.7°F | Ht 68.0 in | Wt 185.6 lb

## 2021-05-25 DIAGNOSIS — M7022 Olecranon bursitis, left elbow: Secondary | ICD-10-CM

## 2021-05-25 MED ORDER — METHYLPREDNISOLONE ACETATE 40 MG/ML IJ SUSP
40.0000 mg | Freq: Once | INTRAMUSCULAR | Status: AC
  Administered 2021-05-25: 40 mg via INTRAMUSCULAR

## 2021-05-25 NOTE — Progress Notes (Signed)
? ?Established Patient Office Visit ? ?Subjective:  ?Patient ID: Adam Oconnell, male    DOB: 08-27-1965  Age: 56 y.o. MRN: [id number] ? ?CC:  ?Chief Complaint  ?Patient presents with  ? Cyst  ?  Patient complains of Cyst on left elbow,x1 month  ? ? ?HPI ?Adam Oconnell presents for 1 month history of left elbow swelling of the olecranon bursa.  Denies any trauma.  No erythema.  No warmth.  No fevers or chills.  No history of gout.  He specifically is requesting this be drained.  No history of similar occurrence.  Denies any other arthralgias. ? ?Past Medical History:  ?Diagnosis Date  ? Acute diverticulitis 06/15/2017  ? w/abscess/notes 06/16/2017  ? Asthma   ? Pneumonia 1980s X 1  ? Seasonal allergies   ? Stomach ulcer   ? "flares up once in a blue moon; last flareup was in ~ 2018" (06/16/2017)  ? ? ?Past Surgical History:  ?Procedure Laterality Date  ? 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 Blocked bowel  ? KNEE ARTHROSCOPY Right 12/2016  ? MCL; ACL  ? ? ?Family History  ?Problem Relation Age of Onset  ? Cancer Mother 65  ?     uterus  ? Heart disease Mother 19  ?     CAD-MI  ? Heart disease Maternal Uncle   ? Alcohol abuse Father   ? Diabetes Brother   ? ? ?Social History  ? ?Socioeconomic History  ? Marital status: Married  ?  Spouse name: Not on file  ? Number of children: Not on file  ? Years of education: Not on file  ? Highest education level: Not on file  ?Occupational History  ? Not on file  ?Tobacco Use  ? Smoking status: Never  ? Smokeless tobacco: Never  ?Vaping Use  ? Vaping Use: Never used  ?Substance and Sexual Activity  ? Alcohol use: Yes  ?  Comment: 06/16/2017 "couple drinks q couple weeks"  ? Drug use: No  ? Sexual activity: Yes  ?Other Topics Concern  ? Not on file  ?Social History Narrative  ? Not on file  ? ?Social Determinants of Health  ? ?Financial Resource Strain: Not on file  ?Food Insecurity: Not on file  ?Transportation Needs: Not on file  ?Physical Activity: Not on file  ?Stress: Not on file   ?Social Connections: Not on file  ?Intimate Partner Violence: Not on file  ? ? ?Outpatient Medications Prior to Visit  ?Medication Sig Dispense Refill  ? albuterol (VENTOLIN HFA) 108 (90 Base) MCG/ACT inhaler USE 2 PUFFS EVERY 8 HOURS AS NEEDED FOR COUGHING AND WHEEZING. 8.5 each 1  ? escitalopram (LEXAPRO) 10 MG tablet TAKE 1 TABLET BY MOUTH EVERY DAY 90 tablet 2  ? ?No facility-administered medications prior to visit.  ? ? ?No Known Allergies ? ?ROS ?Review of Systems  ?Constitutional:  Negative for chills and fever.  ?Musculoskeletal:  Negative for arthralgias.  ? ?  ?Objective:  ?  ?Physical Exam ?Vitals reviewed.  ?Constitutional:   ?   Appearance: Normal appearance.  ?Cardiovascular:  ?   Rate and Rhythm: Normal rate and regular rhythm.  ?Musculoskeletal:  ?   Comments: Full range of motion left elbow.  He has effusion involving the left olecranon bursa.  No warmth.  No erythema.  No localized bony tenderness.  ?Neurological:  ?   Mental Status: He is alert.  ? ? ?BP 120/86 (BP Location: Left Arm, Patient Position: Sitting, Cuff  Size: Normal)   Pulse 91   Temp 97.7 ?F (36.5 ?C) (Oral)   Ht 5\' 8"  (1.727 m)   Wt 185 lb 9.6 oz (84.2 kg)   SpO2 95%   BMI 28.22 kg/m?  ?Wt Readings from Last 3 Encounters:  ?05/25/21 185 lb 9.6 oz (84.2 kg)  ?09/13/20 179 lb 6.4 oz (81.4 kg)  ?07/19/20 178 lb 6.4 oz (80.9 kg)  ? ? ? ?Health Maintenance Due  ?Topic Date Due  ? Zoster Vaccines- Shingrix (2 of 2) 09/02/2017  ? COVID-19 Vaccine (3 - Booster for Pfizer series) 09/13/2019  ? ? ?There are no preventive care reminders to display for this patient. ? ?Lab Results  ?Component Value Date  ? TSH 1.74 07/19/2020  ? ?Lab Results  ?Component Value Date  ? WBC 6.9 07/19/2020  ? HGB 14.8 07/19/2020  ? HCT 43.9 07/19/2020  ? MCV 86.7 07/19/2020  ? PLT 320.0 07/19/2020  ? ?Lab Results  ?Component Value Date  ? NA 139 07/19/2020  ? K 4.4 07/19/2020  ? CO2 28 07/19/2020  ? GLUCOSE 91 07/19/2020  ? BUN 16 07/19/2020  ? CREATININE  1.02 07/19/2020  ? BILITOT 0.7 07/19/2020  ? ALKPHOS 53 07/19/2020  ? AST 16 07/19/2020  ? ALT 19 07/19/2020  ? PROT 7.4 07/19/2020  ? ALBUMIN 4.6 07/19/2020  ? CALCIUM 9.3 07/19/2020  ? ANIONGAP 8 07/07/2020  ? GFR 82.19 07/19/2020  ? ?Lab Results  ?Component Value Date  ? CHOL 211 (H) 07/19/2020  ? ?Lab Results  ?Component Value Date  ? HDL 44.00 07/19/2020  ? ?Lab Results  ?Component Value Date  ? LDLCALC 134 (H) 07/19/2020  ? ?Lab Results  ?Component Value Date  ? TRIG 166.0 (H) 07/19/2020  ? ?Lab Results  ?Component Value Date  ? CHOLHDL 5 07/19/2020  ? ?No results found for: HGBA1C ? ?  ?Assessment & Plan:  ? ?Left olecranon bursitis.  No signs of inflammation to suggest gout or infection. ? ?-Patient requesting drainage.  We explained that these frequently recur even with drainage.  We discussed risk of drainage and steroid injection including low risk of bleeding, bruising, very low risk of infection.  Patient consented.  Prepped skin with Betadine.  Using 21-gauge needle we withdrew 5 mL of serosanguineous fluid.  Patient tolerated well.  We then used number 25-gauge needle and injected 20 mg of Depo-Medrol into the bursa sac.  Coban pressure dressing applied ? ?-We recommend some icing 15 to 20 minutes a few times daily ?-Keep pressure off the elbow ?-He is aware these frequently recur ?-Follow-up immediately for any redness warmth or other concerns ? ?No orders of the defined types were placed in this encounter. ? ? ?Follow-up: No follow-ups on file.  ? ? ?09/18/2020, MD ?

## 2021-09-08 ENCOUNTER — Ambulatory Visit: Payer: Self-pay

## 2021-09-08 ENCOUNTER — Ambulatory Visit
Admission: EM | Admit: 2021-09-08 | Discharge: 2021-09-08 | Disposition: A | Discharge status: Home / Self Care | Payer: 59 | Attending: Family Medicine | Admitting: Family Medicine

## 2021-09-08 DIAGNOSIS — H1031 Unspecified acute conjunctivitis, right eye: Secondary | ICD-10-CM

## 2021-09-08 MED ORDER — OFLOXACIN 0.3 % OP SOLN: 1.0000 [drp] | Freq: Four times a day (QID) | OPHTHALMIC | 0 refills | Status: AC

## 2021-09-08 NOTE — ED Triage Notes (Signed)
Pt reports redness and drainage in right eye  x 1 day. 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 Pt reports family member has pink eye.

## 2021-09-08 NOTE — ED Provider Notes (Signed)
[facility]    CSN: [id number] Arrival date & time: 09/08/21  0950      History   Chief Complaint Chief Complaint  Patient presents with   Appointment    1000   Eye Problem    HPI Adam Oconnell is a 56 y.o. male.   Patient presenting today with 1 day history of right eye redness, drainage, itching.  Denies visual change, injury to the eye, fever, chills, headaches, nausea, vomiting.  Trying some antihistamine drops with no relief so far.  Close family member with pinkeye currently.  History of corneal ulcers also but states this feels very different.    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 Authorizing Provider  ofloxacin (OCUFLOX) 0.3 % ophthalmic solution Place 1 drop into the right eye 4 (four) times daily. 09/08/21  Yes [name], PA-C  albuterol (VENTOLIN HFA) 108 (90 Base) MCG/ACT inhaler USE 2 PUFFS EVERY 8 HOURS AS NEEDED FOR COUGHING AND WHEEZING. 02/13/21   Burchette, Elberta Fortis, MD    Family History Family History  Problem Relation Age of Onset   Cancer Mother 5       uterus   Heart disease Mother 58       CAD-MI   Heart disease Maternal Uncle    Alcohol abuse Father    Diabetes Brother     Social History Social History    Tobacco Use   Smoking status: Never   Smokeless tobacco: Never  Vaping Use   Vaping Use: Never used  Substance Use Topics   Alcohol use: Yes    Comment: 06/16/2017 "couple drinks q couple weeks"   Drug use: No     Allergies   Patient has no known allergies.   Review of Systems Review of Systems Per HPI  Physical Exam Triage Vital Signs ED Triage Vitals  Enc Vitals Group     BP 09/08/21 1038 (!) 150/89     Pulse Rate 09/08/21 1038 85     Resp 09/08/21 1038 16     Temp 09/08/21 1038 98 F (36.7 C)     Temp Source 09/08/21 1038 Oral     SpO2 09/08/21 1038 95 %     Weight --      Height --      Head Circumference --      Peak Flow --      Pain Score 09/08/21 1037 0     Pain Loc --      Pain Edu? --      Excl. in GC? --    No data found.  Updated Vital Signs BP (!) 150/89 (BP Location: Right Arm)  Pulse 85   Temp 98 F (36.7 C) (Oral)   Resp 16   SpO2 95%   Visual Acuity Right Eye Distance:   Left Eye Distance:   Bilateral Distance:    Right Eye Near:   Left Eye Near:    Bilateral Near:     Physical Exam Vitals and nursing note reviewed.  Constitutional:      Appearance: Normal appearance.  HENT:     Head: Atraumatic.     Mouth/Throat:     Mouth: Mucous membranes are moist.     Pharynx: Oropharynx is clear.  Eyes:     Extraocular Movements: Extraocular movements intact.     Pupils: Pupils are equal, round, and reactive to light.     Comments: Right conjunctiva erythematous, injected with clear drainage and mild conjunctival edema  Cardiovascular:     Rate and Rhythm: Normal rate and regular rhythm.  Pulmonary:     Effort: Pulmonary effort is normal.     Breath sounds: Normal breath sounds.  Musculoskeletal:        General: Normal range of motion.     Cervical back: Normal range of motion and neck supple.  Skin:    General: Skin is warm and dry.  Neurological:     General: No focal deficit present.     Mental Status: He is oriented to  person, place, and time.     Motor: No weakness.     Gait: Gait normal.  Psychiatric:        Mood and Affect: Mood normal.        Thought Content: Thought content normal.        Judgment: Judgment normal.      UC Treatments / Results  Labs (all labs ordered are listed, but only abnormal results are displayed) Labs Reviewed - No data to display  EKG   Radiology No results found.  Procedures Procedures (including critical care time)  Medications Ordered in UC Medications - No data to display  Initial Impression / Assessment and Plan / UC Course  I have reviewed the triage vital signs and the nursing notes.  Pertinent labs & imaging results that were available during my care of the patient were reviewed by me and considered in my medical decision making (see chart for details).     Consistent with conjunctivitis, treat with Ocuflox drops, warm compresses, good hand hygiene.  Return for worsening symptoms.  Declines visual acuity as vision is intact per patient.  Final Clinical Impressions(s) / UC Diagnoses   Final diagnoses:  Acute conjunctivitis of right eye, unspecified acute conjunctivitis type   Discharge Instructions   None    ED Prescriptions     Medication Sig Dispense Auth. Provider   ofloxacin (OCUFLOX) 0.3 % ophthalmic solution Place 1 drop into the right eye 4 (four) times daily. 5 mL [name], [place]      PDMP not reviewed this encounter.   [name], [place] 09/08/21 1051

## 2022-01-16 ENCOUNTER — Ambulatory Visit: Payer: 59 | Admitting: Family Medicine

## 2022-01-16 ENCOUNTER — Encounter: Payer: Self-pay | Admitting: Family Medicine

## 2022-01-16 VITALS — BP 136/96 | HR 100 | Temp 97.8°F | Ht 68.0 in | Wt 183.1 lb

## 2022-01-16 DIAGNOSIS — J019 Acute sinusitis, unspecified: Secondary | ICD-10-CM | POA: Diagnosis not present

## 2022-01-16 MED ORDER — AMOXICILLIN-POT CLAVULANATE 875-125 MG PO TABS: 1.0000 | ORAL_TABLET | Freq: Two times a day (BID) | ORAL | 0 refills | Status: DC

## 2022-01-16 NOTE — Progress Notes (Signed)
   Established Patient Office Visit  Subjective   Patient ID: Adam Oconnell, male    DOB: [birth year]  Age: 56 y.o. MRN: [id number]  Chief Complaint  Patient presents with   Sinusitis    Patient complains of sinusitis, x3 days    Cough    Patient complains of cough, x3 days, Tried Tylenol with little relief, Non productive   Headache    Patient complains of headache, x3 days, Tried Tylenol with little relief     HPI   Adam Oconnell is seen with sinusitis symptoms.  He states around middle of October was diagnosed with COVID.  Thinks he may have some lingering fatigue.  He was feeling somewhat better but then over week ago sore having increased nasal congestion, headaches, purulent nasal secretions.  Progressively worsening symptoms since onset.  Has been doing some saline rinses without much improvement.  Tylenol without improvement.  No fever.  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 He has never used smokeless tobacco. He reports current alcohol use. He reports that he does not use drugs. family history includes Alcohol abuse in his father; Cancer (age of onset: 41) in his mother; Diabetes in his brother; Heart disease in his maternal uncle; Heart disease (age of onset: 48) in his mother. No Known Allergies  Review of Systems  Constitutional:  Negative for chills and fever.  HENT:  Positive for congestion and sinus pain.   Respiratory:  Positive for cough. Negative for hemoptysis.   Neurological:  Positive for headaches.      Objective:     BP (!) 136/96 (BP Location: Left Arm, Patient  Position: Sitting, Cuff Size: Normal)   Pulse 100   Temp 97.8 F (36.6 C) (Oral)   Ht 5\' 8"  (1.727 m)   Wt 183 lb 1.6 oz (83.1 kg)   SpO2 98%   BMI 27.84 kg/m    Physical Exam Vitals reviewed.  Constitutional:      General: He is not in acute distress.    Appearance: He is well-developed.  Cardiovascular:     Rate and Rhythm: Normal rate and regular rhythm.  Pulmonary:     Effort: Pulmonary effort is normal.     Breath sounds: Normal breath sounds. No wheezing or rales.  Musculoskeletal:     Cervical back: Neck supple.  Neurological:     Mental Status: He is alert.      No results found for any visits on 01/16/22.    The 10-year ASCVD risk score (Arnett DK, et al., 2019) is: 8.7%    Assessment & Plan:   Acute sinusitis following recent COVID infection.  Start Augmentin 875 mg twice daily for 10 days.  Plenty fluids.  Consider over-the-counter Mucinex.  Follow-up for any persistent or worsening symptoms.  No follow-ups on file.    2020, MD

## 2022-03-20 ENCOUNTER — Telehealth: Payer: 59 | Admitting: Physician Assistant

## 2022-03-20 DIAGNOSIS — B9689 Other specified bacterial agents as the cause of diseases classified elsewhere: Secondary | ICD-10-CM | POA: Diagnosis not present

## 2022-03-20 DIAGNOSIS — J019 Acute sinusitis, unspecified: Secondary | ICD-10-CM

## 2022-03-20 MED ORDER — DOXYCYCLINE HYCLATE 100 MG PO TABS: 100.0000 mg | ORAL_TABLET | Freq: Two times a day (BID) | ORAL | 0 refills | Status: DC

## 2022-03-20 NOTE — Progress Notes (Signed)
E-Visit for Sinus Problems  We are sorry that you are not feeling well.  Here is how we plan to help!  Based on what you have shared with me it looks like you have sinusitis.  Sinusitis is inflammation and infection in the sinus cavities of the head.  Based on your presentation I believe you most likely have Acute Bacterial Sinusitis.  This is an infection caused by bacteria and is treated with antibiotics. I have prescribed Doxycycline 100mg by mouth twice a day for 10 days. You may use an oral decongestant such as Mucinex D or if you have glaucoma or high blood pressure use plain Mucinex. Saline nasal spray help and can safely be used as often as needed for congestion.  If you develop worsening sinus pain, fever or notice severe headache and vision changes, or if symptoms are not better after completion of antibiotic, please schedule an appointment with a health care provider.    Sinus infections are not as easily transmitted as other respiratory infection, however we still recommend that you avoid close contact with loved ones, especially the very young and elderly.  Remember to wash your hands thoroughly throughout the day as this is the number one way to prevent the spread of infection!  Home Care: Only take medications as instructed by your medical team. Complete the entire course of an antibiotic. Do not take these medications with alcohol. A steam or ultrasonic humidifier can help congestion.  You can place a towel over your head and breathe in the steam from hot water coming from a faucet. Avoid close contacts especially the very young and the elderly. Cover your mouth when you cough or sneeze. Always remember to wash your hands.  Get Help Right Away If: You develop worsening fever or sinus pain. You develop a severe head ache or visual changes. Your symptoms persist after you have completed your treatment plan.  Make sure you Understand these instructions. Will watch your  condition. Will get help right away if you are not doing well or get worse.  Thank you for choosing an e-visit.  Your e-visit answers were reviewed by a board certified advanced clinical practitioner to complete your personal care plan. Depending upon the condition, your plan could have included both over the counter or prescription medications.  Please review your pharmacy choice. Make sure the pharmacy is open so you can pick up prescription now. If there is a problem, you may contact your provider through MyChart messaging and have the prescription routed to another pharmacy.  Your safety is important to us. If you have drug allergies check your prescription carefully.   For the next 24 hours you can use MyChart to ask questions about today's visit, request a non-urgent call back, or ask for a work or school excuse. You will get an email in the next two days asking about your experience. I hope that your e-visit has been valuable and will speed your recovery.  I have spent 5 minutes in review of e-visit questionnaire, review and updating patient chart, medical decision making and response to patient.   Doral Ventrella M Anshu Wehner, PA-C  

## 2022-09-02 ENCOUNTER — Encounter: Payer: Self-pay | Admitting: Family Medicine

## 2022-09-02 ENCOUNTER — Ambulatory Visit (INDEPENDENT_AMBULATORY_CARE_PROVIDER_SITE_OTHER): Payer: 59 | Admitting: Family Medicine

## 2022-09-02 VITALS — BP 150/90 | HR 87 | Ht 68.5 in | Wt 180.1 lb

## 2022-09-02 DIAGNOSIS — E785 Hyperlipidemia, unspecified: Secondary | ICD-10-CM

## 2022-09-02 DIAGNOSIS — Z0001 Encounter for general adult medical examination with abnormal findings: Secondary | ICD-10-CM | POA: Diagnosis not present

## 2022-09-02 DIAGNOSIS — Z125 Encounter for screening for malignant neoplasm of prostate: Secondary | ICD-10-CM

## 2022-09-02 DIAGNOSIS — L989 Disorder of the skin and subcutaneous tissue, unspecified: Secondary | ICD-10-CM

## 2022-09-02 DIAGNOSIS — Z0000 Encounter for general adult medical examination without abnormal findings: Secondary | ICD-10-CM

## 2022-09-02 LAB — LDL CHOLESTEROL, DIRECT: Direct LDL: 133 mg/dL

## 2022-09-02 LAB — CBC WITH DIFFERENTIAL/PLATELET
Basophils Absolute: 0 10*3/uL (ref 0.0–0.1)
Basophils Relative: 0.5 % (ref 0.0–3.0)
Eosinophils Absolute: 0.2 10*3/uL (ref 0.0–0.7)
Eosinophils Relative: 2.7 % (ref 0.0–5.0)
HCT: 47.8 % (ref 39.0–52.0)
Hemoglobin: 15.8 g/dL (ref 13.0–17.0)
Lymphocytes Relative: 34.6 % (ref 12.0–46.0)
Lymphs Abs: 2.8 10*3/uL (ref 0.7–4.0)
MCHC: 33.1 g/dL (ref 30.0–36.0)
MCV: 87.7 fl (ref 78.0–100.0)
Monocytes Absolute: 0.6 10*3/uL (ref 0.1–1.0)
Monocytes Relative: 6.9 % (ref 3.0–12.0)
Neutro Abs: 4.4 10*3/uL (ref 1.4–7.7)
Neutrophils Relative %: 55.3 % (ref 43.0–77.0)
Platelets: 345 10*3/uL (ref 150.0–400.0)
RBC: 5.45 Mil/uL (ref 4.22–5.81)
RDW: 13.6 % (ref 11.5–15.5)
WBC: 8 10*3/uL (ref 4.0–10.5)

## 2022-09-02 LAB — BASIC METABOLIC PANEL
BUN: 13 mg/dL (ref 6–23)
CO2: 28 mEq/L (ref 19–32)
Calcium: 9.7 mg/dL (ref 8.4–10.5)
Chloride: 102 mEq/L (ref 96–112)
Creatinine, Ser: 1.06 mg/dL (ref 0.40–1.50)
GFR: 77.32 mL/min (ref >60.00)
Glucose, Bld: 78 mg/dL (ref 70–99)
Potassium: 4.8 mEq/L (ref 3.5–5.1)
Sodium: 138 mEq/L (ref 135–145)

## 2022-09-02 LAB — LIPID PANEL
Cholesterol: 202 mg/dL — ABNORMAL HIGH (ref 0–200)
HDL: 39.1 mg/dL (ref >39.00)
NonHDL: 162.68
Total CHOL/HDL Ratio: 5
Triglycerides: 276 mg/dL — ABNORMAL HIGH (ref 0.0–149.0)
VLDL: 55.2 mg/dL — ABNORMAL HIGH (ref 0.0–40.0)

## 2022-09-02 LAB — HEPATIC FUNCTION PANEL
ALT: 21 U/L (ref 0–53)
AST: 17 U/L (ref 0–37)
Albumin: 4.5 g/dL (ref 3.5–5.2)
Alkaline Phosphatase: 59 U/L (ref 39–117)
Bilirubin, Direct: 0.1 mg/dL (ref 0.0–0.3)
Total Bilirubin: 0.5 mg/dL (ref 0.2–1.2)
Total Protein: 7.8 g/dL (ref 6.0–8.3)

## 2022-09-02 LAB — PSA: PSA: 2.01 ng/mL (ref 0.10–4.00)

## 2022-09-02 NOTE — Progress Notes (Signed)
Established Patient Office Visit  Subjective   Patient ID: Adam Oconnell, male    DOB: 05-04-1965  Age: 57 y.o. MRN: [id number]  No chief complaint on file.   HPI   Adam Oconnell is seen today for physical exam-and for second separate medical issue as below.  He has history of hyperlipidemia and mild intermittent asthma.  Remote history of peptic ulcer.  His wife has significant health problems and he stays fairly involved taking care of her.  He is generally doing well overall although.  Currently not exercising much.  Eating a lot of fast food and he realizes his diet is poor.  He has slightly raised somewhat hyperkeratotic skin lesion right cheek area that came up suddenly weeks ago.  Bothersome because of location with shaving.  Has been traumatized couple times.  Health maintenance reviewed  -Had 1 shingles vaccine but never got his second Shingrix -Will need tetanus booster this September -Colonoscopy due 2021 -Last PSA 2 years ago  Social history-married for 34 years.  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  Rare alcohol.  Never smoked.   Family history-mother had history of uterine cancer.  She had coronary disease with first MI in her mid 3s and second around age 5.  Father's history is not much known.  He did have history of alcoholism.  He has 1 sister he has history of mental retardation and he serves as guardian for her.  He has 4 brothers.  One died of motor vehicle accident and other 3 are alive and well.  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 He has never used  smokeless tobacco. He reports current alcohol use. He reports that he does not use drugs. family history includes Alcohol abuse in his father; Cancer (age of onset: 58) in his mother; Diabetes in his brother; Heart disease in his maternal uncle; Heart disease (age of onset: 8) in his mother. No Known Allergies   Review of Systems  Constitutional:  Negative for chills, fever, malaise/fatigue and weight loss.  HENT:  Negative for hearing loss.   Eyes:  Negative for blurred vision and double vision.  Respiratory:  Negative for cough and shortness of breath.   Cardiovascular:  Negative for chest pain, palpitations and leg swelling.  Gastrointestinal:  Negative for abdominal pain, blood in stool, constipation and diarrhea.  Genitourinary:  Negative for dysuria.  Skin:  Negative for rash.  Neurological:  Negative for dizziness, speech change, seizures, loss of consciousness and headaches.  Psychiatric/Behavioral:  Negative for depression.       Objective:     BP (!) 150/90 (BP Location: Left Arm, Patient Position: Sitting, Cuff Size: Normal)   Pulse 87   Ht 5' 8.5" (1.74 m)   Wt 180 lb 1.6 oz (81.7 kg)   SpO2 97%   BMI 26.98 kg/m  BP Readings from Last 3 Encounters:  09/02/22 (!) 150/90  01/16/22 (!) 136/96  09/08/21 (!) 150/89   Wt Readings from Last 3 Encounters:  09/02/22  180 lb 1.6 oz (81.7 kg)  01/16/22 183 lb 1.6 oz (83.1 kg)  05/25/21 185 lb 9.6 oz (84.2 kg)      Physical Exam Constitutional:      General: He is not in acute distress.    Appearance: He is well-developed.  HENT:     Head: Normocephalic and atraumatic.     Right Ear: External ear normal.     Left Ear: External ear normal.  Eyes:     Conjunctiva/sclera: Conjunctivae normal.     Pupils: Pupils are equal, round, and reactive to light.  Neck:     Thyroid: No thyromegaly.  Cardiovascular:     Rate and Rhythm: Normal rate and regular rhythm.     Heart sounds: Normal heart sounds. No murmur  heard. Pulmonary:     Effort: No respiratory distress.     Breath sounds: No wheezing or rales.  Abdominal:     General: Bowel sounds are normal. There is no distension.     Palpations: Abdomen is soft. There is no mass.     Tenderness: There is no abdominal tenderness. There is no guarding or rebound.  Musculoskeletal:     Cervical back: Normal range of motion and neck supple.  Lymphadenopathy:     Cervical: No cervical adenopathy.  Skin:    Findings: No rash.     Comments: He has skin lesion right cheek area which is only about 2 mm diameter at the base and raised with hyperkeratotic surface and somewhat excoriated scab surface  Neurological:     Mental Status: He is alert and oriented to person, place, and time.     Cranial Nerves: No cranial nerve deficit.      No results found for any visits on 09/02/22.    The 10-year ASCVD risk score (Arnett DK, et al., 2019) is: 11.1%    Assessment & Plan:   Problem List Items Addressed This Visit       Unprioritized   Dyslipidemia - Primary   Relevant Orders   CT CARDIAC SCORING (SELF PAY ONLY)   Other Visit Diagnoses     Physical exam       Relevant Orders   Lipid panel   Basic metabolic panel   CBC with Differential/Platelet   Hepatic function panel   PSA     -Patient has history of hyperlipidemia with 10-year calculated risk for CAD 11.1%.  We discussed coronary calcium score for further risk stratification and he would like to proceed with that  -Discussed diet in some detail.  Recommend low saturated fat diet.  Try to lose some weight.  -Blood pressure up-to-date.  Handout on DASH diet given.  Try to restrict sodium intake.  Try to lose some weight.  Monitor blood pressure closely at home and bring back his cuff along with readings for reassessment about 6 to 8 weeks.  If still up at that time initiate treatment  -Obtain screening labs as above  -Patient requesting treatment of right facial lesion.  We discussed  risk and benefits of liquid nitrogen treatment including risk of pain, blistering, low risk of infection, and low risk of scarring.  Patient consented.  Treated with liquid nitrogen without difficulty.  Patient tolerated well.  Reassess at follow-up in 6 to 8 weeks.  If lesion not fully resolved at that point we will recommend shave biopsy  Return in about 2 months (around 11/02/2022).    Evelena Peat, MD

## 2022-09-02 NOTE — Patient Instructions (Signed)
Set up 2 month follow up to reassess BP and bring your cuff at that visit.    I will be setting up coronary calcium scan.

## 2022-10-08 ENCOUNTER — Ambulatory Visit (HOSPITAL_BASED_OUTPATIENT_CLINIC_OR_DEPARTMENT_OTHER)
Admission: RE | Admit: 2022-10-08 | Discharge: 2022-10-08 | Disposition: A | Discharge status: Home / Self Care | Payer: 59 | Source: Ambulatory Visit | Attending: Family Medicine | Admitting: Family Medicine

## 2022-10-08 DIAGNOSIS — E785 Hyperlipidemia, unspecified: Secondary | ICD-10-CM | POA: Insufficient documentation

## 2022-10-09 ENCOUNTER — Encounter (INDEPENDENT_AMBULATORY_CARE_PROVIDER_SITE_OTHER): Payer: Self-pay

## 2022-11-01 ENCOUNTER — Encounter: Payer: Self-pay | Admitting: Family Medicine

## 2022-11-01 ENCOUNTER — Ambulatory Visit: Payer: 59 | Admitting: Family Medicine

## 2022-11-01 VITALS — BP 150/88 | HR 82 | Temp 97.6°F | Ht 68.5 in | Wt 183.7 lb

## 2022-11-01 DIAGNOSIS — Z23 Encounter for immunization: Secondary | ICD-10-CM | POA: Diagnosis not present

## 2022-11-01 DIAGNOSIS — R030 Elevated blood-pressure reading, without diagnosis of hypertension: Secondary | ICD-10-CM | POA: Diagnosis not present

## 2022-11-01 NOTE — Addendum Note (Signed)
Addended by: Christy Sartorius on: 11/01/2022 07:36 AM   Modules accepted: Orders

## 2022-11-01 NOTE — Patient Instructions (Signed)
Set up 4 month follow up and bring in your cuff at follow up.

## 2022-11-01 NOTE — Progress Notes (Signed)
Established Patient Office Visit  Subjective   Patient ID: Adam Oconnell, male    DOB: [birth year]  Age: 57 y.o. MRN: [id number]  Chief Complaint  Patient presents with   Medical Management of Chronic Issues    HPI   Adam Oconnell is seen for follow-up blood pressure elevation noted on recent physical.  He has consistently gotten better readings at home.  In fact, he states his average for the past several weeks has been around 136/83.  He has automated cuff and had intended to bring this today but forgot.  Currently not exercising regularly.  No recent headaches or dizziness.  No regular alcohol.  Does not take any regular nonsteroidals.  No regular over-the-counter medications.  Only occasional aspirin. He is also requesting Shingrix vaccine.  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 He has never used smokeless tobacco. He reports current alcohol use. He reports that he does not use drugs. family history includes Alcohol abuse in his father; Cancer (age of onset: 85) in his mother; Diabetes in his brother; Heart disease in his maternal uncle; Heart disease (age of onset: 2) in his mother. No Known Allergies  Review of Systems  Constitutional:  Negative for malaise/fatigue.  Eyes:  Negative for blurred vision.  Respiratory:  Negative for shortness of breath.   Cardiovascular:  Negative for chest pain.  Neurological:  Negative for dizziness, weakness and headaches.      Objective:     BP (!) 150/88 (BP Location: Left Arm, Cuff Size: Normal)   Pulse 82   Temp 97.6 F (36.4 C) (Oral)   Ht  5' 8.5" (1.74 m)   Wt 183 lb 11.2 oz (83.3 kg)   SpO2 98%   BMI 27.53 kg/m  BP Readings from Last 3 Encounters:  11/01/22 (!) 150/88  09/04/22 (!) 150/90  01/16/22 (!) 136/96   Wt Readings from Last 3 Encounters:  11/01/22 183 lb 11.2 oz (83.3 kg)  09/02/22 180 lb 1.6 oz (81.7 kg)  01/16/22 183 lb 1.6 oz (83.1 kg)      Physical Exam Vitals reviewed.  Constitutional:      Appearance: He is well-developed.  Neck:     Thyroid: No thyromegaly.  Cardiovascular:     Rate and Rhythm: Normal rate and regular rhythm.     Heart sounds: No murmur heard. Pulmonary:     Effort: Pulmonary effort is normal. No respiratory distress.     Breath sounds: Normal breath sounds. No wheezing or rales.  Musculoskeletal:     Cervical back: Neck supple.  Neurological:     Mental Status: He is alert and oriented to person, place, and time.      No results found for any visits on 11/01/22.  Last CBC Lab Results  Component Value Date   WBC 8.0 09/02/2022   HGB 15.8 09/02/2022   HCT 47.8 09/02/2022   MCV 87.7 09/02/2022   MCH 29.6 07/07/2020   RDW 13.6 09/02/2022   PLT 345.0 09/02/2022   Last metabolic  panel Lab Results  Component Value Date   GLUCOSE 78 09/02/2022   NA 138 09/02/2022   K 4.8 09/02/2022   CL 102 09/02/2022   CO2 28 09/02/2022   BUN 13 09/02/2022   CREATININE 1.06 09/02/2022   GFR 77.32 09/02/2022   CALCIUM 9.7 09/02/2022   PROT 7.8 09/02/2022   ALBUMIN 4.5 09/02/2022   BILITOT 0.5 09/02/2022   ALKPHOS 59 09/02/2022   AST 17 09/02/2022   ALT 21 09/02/2022   ANIONGAP 8 07/07/2020   Last lipids Lab Results  Component Value Date   CHOL 202 (H) 09/02/2022   HDL 39.10 09/02/2022   LDLCALC 134 (H) 07/19/2020   LDLDIRECT 133.0 09/02/2022   TRIG 276.0 (H) 09/02/2022   CHOLHDL 5 09/02/2022   Last hemoglobin A1c No results found for: "HGBA1C" Last thyroid functions Lab Results  Component Value Date   TSH 1.74 07/19/2020      The 10-year ASCVD risk  score (Arnett DK, et al., 2019) is: 11.6%    Assessment & Plan:   #1 elevated blood pressure.  Suspected whitecoat syndrome.  Home readings consistently well-controlled.  We recommended he bring his cuff in for follow-up in 4 months.  Watch sodium intake.  Try to lose a few pounds.  Try to establish more consistent aerobic exercise.  #2 health maintenance.  Patient would like to go ahead with Shingrix vaccine.  We did discuss possible side effects.  Patient consents.  Shingrix given and repeat second vaccine in 2 to 6 months  Evelena Peat, MD

## 2023-02-21 ENCOUNTER — Ambulatory Visit: Payer: 59 | Admitting: Family Medicine

## 2023-02-21 VITALS — BP 140/90 | HR 89 | Temp 97.9°F | Ht <= 58 in | Wt 186.8 lb

## 2023-02-21 DIAGNOSIS — I10 Essential (primary) hypertension: Secondary | ICD-10-CM | POA: Diagnosis not present

## 2023-02-21 MED ORDER — VALSARTAN 80 MG PO TABS: 80.0000 mg | ORAL_TABLET | Freq: Every day | ORAL | 3 refills | Status: AC

## 2023-02-21 NOTE — Progress Notes (Signed)
Established Patient Office Visit  Subjective   Patient ID: Adam Oconnell, male    DOB: 1965-11-20  Age: 57 y.o. MRN: [id number]  Chief Complaint  Patient presents with   Medical Management of Chronic Issues    HPI   Adam Oconnell is here for follow-up regarding blood pressure.  He brings in his cuff today.  Last visit was elevated here in office.  He brings in log of readings and most of these are fairly well-controlled though he had some sporadic readings over 140s systolic and over 90 diastolic.  Occasional headaches but not frequently.  Denies any dizziness or chest pains.  Currently not exercising.  Has gained a few pounds since last visit.  Poor compliance with diet at times.  Strong family history of hypertension and several family members.  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 He has never used smokeless tobacco. He reports current alcohol use. He reports that he does not use drugs. family history includes Alcohol abuse in his father; Cancer (age of onset: 101) in his mother; Diabetes in his brother; Heart disease in his maternal uncle; Heart disease (age of onset: 89) in his mother. No Known Allergies  Review of Systems  Constitutional:  Negative for malaise/fatigue.  Eyes:  Negative for blurred vision.  Respiratory:  Negative for shortness of breath.   Cardiovascular:  Negative for chest pain.  Neurological:  Negative for dizziness and weakness.      Objective:     BP (!) 140/90 (BP Location: Left Arm, Patient Position: Sitting, Cuff Size: Normal)   Pulse 89   Temp 97.9 F  (36.6 C) (Oral)   Ht 3\' 4"  (1.016 m)   Wt 186 lb 12.8 oz (84.7 kg)   SpO2 97%   BMI 82.08 kg/m  BP Readings from Last 3 Encounters:  02/21/23 (!) 140/90  11/01/22 (!) 150/88  09/04/22 (!) 150/90   Wt Readings from Last 3 Encounters:  02/21/23 186 lb 12.8 oz (84.7 kg)  11/01/22 183 lb 11.2 oz (83.3 kg)  09/02/22 180 lb 1.6 oz (81.7 kg)      Physical Exam Vitals reviewed.  Constitutional:      General: He is not in acute distress.    Appearance: Normal appearance. He is not toxic-appearing.  Cardiovascular:     Rate and Rhythm: Normal rate and regular rhythm.  Pulmonary:     Effort: Pulmonary effort is normal.     Breath sounds: Normal breath sounds.  Neurological:     Mental Status: He is alert.      No results found for any visits on 02/21/23.  Last CBC Lab Results  Component Value Date   WBC 8.0 09/02/2022   HGB 15.8 09/02/2022   HCT 47.8 09/02/2022   MCV 87.7 09/02/2022   MCH 29.6 07/07/2020   RDW 13.6 09/02/2022   PLT 345.0 09/02/2022   Last metabolic panel Lab Results  Component Value Date   GLUCOSE 78 09/02/2022   NA 138 09/02/2022   K 4.8  09/02/2022   CL 102 09/02/2022   CO2 28 09/02/2022   BUN 13 09/02/2022   CREATININE 1.06 09/02/2022   GFR 77.32 09/02/2022   CALCIUM 9.7 09/02/2022   PROT 7.8 09/02/2022   ALBUMIN 4.5 09/02/2022   BILITOT 0.5 09/02/2022   ALKPHOS 59 09/02/2022   AST 17 09/02/2022   ALT 21 09/02/2022   ANIONGAP 8 07/07/2020   Last lipids Lab Results  Component Value Date   CHOL 202 (H) 09/02/2022   HDL 39.10 09/02/2022   LDLCALC 134 (H) 07/19/2020   LDLDIRECT 133.0 09/02/2022   TRIG 276.0 (H) 09/02/2022   CHOLHDL 5 09/02/2022      The 09-UEAV ASCVD risk score (Arnett DK, et al., 2019) is: 11.2%    Assessment & Plan:   Hypertension.  May have element of whitecoat syndrome but even with home readings has had some sporadic elevations.  He brings in his cuff today and we obtained fairly similar readings.  Repeat  by me left arm seated after rest 142/90.  We elected to go ahead and initiate valsartan 80 mg once daily.  Handout on DASH diet given.  He is challenged to try to lose some weight and step up exercise.  Set up follow-up in approximately 1 month to reassess and check basic metabolic panel then  Evelena Peat, MD

## 2023-03-07 ENCOUNTER — Ambulatory Visit: Payer: 59 | Admitting: Family Medicine

## 2023-09-05 ENCOUNTER — Encounter: Payer: Self-pay | Admitting: Family Medicine

## 2023-09-05 ENCOUNTER — Ambulatory Visit (INDEPENDENT_AMBULATORY_CARE_PROVIDER_SITE_OTHER): Admitting: Family Medicine

## 2023-09-05 VITALS — BP 136/86 | HR 84 | Temp 97.8°F | Ht 68.5 in | Wt 181.7 lb

## 2023-09-05 DIAGNOSIS — Z125 Encounter for screening for malignant neoplasm of prostate: Secondary | ICD-10-CM

## 2023-09-05 DIAGNOSIS — Z23 Encounter for immunization: Secondary | ICD-10-CM | POA: Diagnosis not present

## 2023-09-05 DIAGNOSIS — Z0000 Encounter for general adult medical examination without abnormal findings: Secondary | ICD-10-CM | POA: Diagnosis not present

## 2023-09-05 LAB — CBC WITH DIFFERENTIAL/PLATELET
Basophils Absolute: 0 10*3/uL (ref 0.0–0.1)
Basophils Relative: 0.6 % (ref 0.0–3.0)
Eosinophils Absolute: 0.3 10*3/uL (ref 0.0–0.7)
Eosinophils Relative: 3.8 % (ref 0.0–5.0)
HCT: 45.3 % (ref 39.0–52.0)
Hemoglobin: 15.2 g/dL (ref 13.0–17.0)
Lymphocytes Relative: 27.6 % (ref 12.0–46.0)
Lymphs Abs: 2.2 10*3/uL (ref 0.7–4.0)
MCHC: 33.5 g/dL (ref 30.0–36.0)
MCV: 85.3 fl (ref 78.0–100.0)
Monocytes Absolute: 0.5 10*3/uL (ref 0.1–1.0)
Monocytes Relative: 5.9 % (ref 3.0–12.0)
Neutro Abs: 5 10*3/uL (ref 1.4–7.7)
Neutrophils Relative %: 62.1 % (ref 43.0–77.0)
Platelets: 316 10*3/uL (ref 150.0–400.0)
RBC: 5.31 Mil/uL (ref 4.22–5.81)
RDW: 13.7 % (ref 11.5–15.5)
WBC: 8 10*3/uL (ref 4.0–10.5)

## 2023-09-05 LAB — BASIC METABOLIC PANEL WITH GFR
BUN: 18 mg/dL (ref 6–23)
CO2: 27 meq/L (ref 19–32)
Calcium: 9.5 mg/dL (ref 8.4–10.5)
Chloride: 103 meq/L (ref 96–112)
Creatinine, Ser: 1.09 mg/dL (ref 0.40–1.50)
GFR: 74.25 mL/min (ref >60.00)
Glucose, Bld: 100 mg/dL — ABNORMAL HIGH (ref 70–99)
Potassium: 4.6 meq/L (ref 3.5–5.1)
Sodium: 139 meq/L (ref 135–145)

## 2023-09-05 LAB — LIPID PANEL
Cholesterol: 221 mg/dL — ABNORMAL HIGH (ref 0–200)
HDL: 39.6 mg/dL (ref >39.00)
LDL Cholesterol: 145 mg/dL — ABNORMAL HIGH (ref 0–99)
NonHDL: 181
Total CHOL/HDL Ratio: 6
Triglycerides: 182 mg/dL — ABNORMAL HIGH (ref 0.0–149.0)
VLDL: 36.4 mg/dL (ref 0.0–40.0)

## 2023-09-05 LAB — HEPATIC FUNCTION PANEL
ALT: 21 U/L (ref 0–53)
AST: 16 U/L (ref 0–37)
Albumin: 4.5 g/dL (ref 3.5–5.2)
Alkaline Phosphatase: 50 U/L (ref 39–117)
Bilirubin, Direct: 0.1 mg/dL (ref 0.0–0.3)
Total Bilirubin: 0.5 mg/dL (ref 0.2–1.2)
Total Protein: 7.5 g/dL (ref 6.0–8.3)

## 2023-09-05 LAB — PSA: PSA: 1.92 ng/mL (ref 0.10–4.00)

## 2023-09-05 NOTE — Progress Notes (Signed)
 Established Patient Office Visit  Subjective   Patient ID: Adam Oconnell, male    DOB: Jan 08, 1966  Age: 58 y.o. MRN: [id number]  Chief Complaint  Patient presents with   Annual Exam    HPI   Adam Oconnell is seen for physical exam.  Generally doing well.  He has had some recent dyspepsia and started himself on some Prilosec.  He has had occasional loose stools and some increased gaseous symptoms.  No recent major dietary changes.  Is considering starting a probiotic.  He takes Diovan  for hypertension.  No other regular prescription medications.  Blood pressures at home have consistently been well-controlled.  Health maintenance reviewed  Health Maintenance  Topic Date Due   Pneumococcal Vaccine 51-82 Years old (1 of 2 - PCV) Never done   Hepatitis B Vaccines (1 of 3 - 19+ 3-dose series) Never done   COVID-19 Vaccine (3 - 2024-25 season) 11/10/2022   DTaP/Tdap/Td (3 - Td or Tdap) 11/13/2022   Hepatitis C Screening  06/14/2036 (Originally 02/02/1982)   INFLUENZA VACCINE  10/10/2023   Colonoscopy  10/13/2029   HIV Screening  Completed   Zoster Vaccines- Shingrix   Completed   HPV VACCINES  Aged Out   Meningococcal B Vaccine  Aged Out   - Has not had pneumonia vaccine and tetanus is due.  He would like to go ahead with both today. -He has had 2 Shingrix  vaccines but unfortunately these were not within 6 months of each other. -Colonoscopy due 2031  Social history-married for 35 years.  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  Rare alcohol.  Never smoked.  Daughter passed away few years ago from a rare disorder.   Family history-mother had history of uterine cancer.  She had coronary disease with first MI in her mid 51s and second around age 35.  Father's history is not much known.  He did have history of alcoholism.  He has 1 sister he has history of mental retardation and he serves as guardian for her.  He has 4 brothers.  One died of motor vehicle accident and other 3 are alive  and some have hypertension.  He also relates paternal grandmother had CVA 16  Adam Oconnell had coronary calcium score last summer of 0.  Past Medical History:  Diagnosis Date   Acute diverticulitis 06/15/2017   w/abscess/notes 06/16/2017   Asthma    Pneumonia 1980s X 1   Seasonal allergies    Stomach ulcer    flares up once in a blue moon; last flareup was in ~ 2018 (06/16/2017)     reports that he has never smoked. He has never used smokeless tobacco. He reports current alcohol use. He reports that he does not use drugs. family history includes Alcohol abuse in his father; Cancer (age of onset: 32) in his mother; Diabetes in his brother; Heart disease in his maternal uncle; Heart disease (age of onset: 65) in his mother. No Known Allergies   Review of Systems  Constitutional:  Negative for chills, fever, malaise/fatigue and weight loss.  HENT:  Negative for hearing loss.   Eyes:  Negative for blurred vision and double vision.  Respiratory:  Negative for cough and shortness of breath.   Cardiovascular:  Negative for chest pain, palpitations and leg swelling.  Gastrointestinal:  Negative for blood in stool, constipation, melena, nausea and vomiting.  Genitourinary:  Negative for dysuria.  Skin:  Negative for rash.  Neurological:  Negative for dizziness, speech change, seizures, loss of  consciousness and headaches.  Psychiatric/Behavioral:  Negative for depression.       Objective:     BP 136/86 (BP Location: Left Arm, Cuff Size: Normal)   Pulse 84   Temp 97.8 F (36.6 C) (Oral)   Ht 5' 8.5 (1.74 m)   Wt 181 lb 11.2 oz (82.4 kg)   SpO2 96%   BMI 27.22 kg/m  BP Readings from Last 3 Encounters:  09/05/23 136/86  02/24/23 (!) 140/90  11/01/22 (!) 150/88   Wt Readings from Last 3 Encounters:  09/05/23 181 lb 11.2 oz (82.4 kg)  02/21/23 186 lb 12.8 oz (84.7 kg)  11/01/22 183 lb 11.2 oz (83.3 kg)      Physical Exam Vitals reviewed.  Constitutional:      General: He is not  in acute distress.    Appearance: He is well-developed. He is not toxic-appearing.  HENT:     Head: Normocephalic and atraumatic.     Right Ear: External ear normal.     Left Ear: External ear normal.   Eyes:     Conjunctiva/sclera: Conjunctivae normal.     Pupils: Pupils are equal, round, and reactive to light.   Neck:     Thyroid : No thyromegaly.   Cardiovascular:     Rate and Rhythm: Normal rate and regular rhythm.     Heart sounds: Normal heart sounds. No murmur heard. Pulmonary:     Effort: No respiratory distress.     Breath sounds: No wheezing or rales.  Abdominal:     General: Bowel sounds are normal. There is no distension.     Palpations: Abdomen is soft. There is no mass.     Tenderness: There is no abdominal tenderness. There is no guarding or rebound.   Musculoskeletal:     Cervical back: Normal range of motion and neck supple.     Right lower leg: No edema.     Left lower leg: No edema.  Lymphadenopathy:     Cervical: No cervical adenopathy.   Skin:    Findings: No rash.   Neurological:     Mental Status: He is alert and oriented to person, place, and time.     Cranial Nerves: No cranial nerve deficit.      No results found for any visits on 09/05/23.  Last CBC Lab Results  Component Value Date   WBC 8.0 09/02/2022   HGB 15.8 09/02/2022   HCT 47.8 09/02/2022   MCV 87.7 09/02/2022   MCH 29.6 07/07/2020   RDW 13.6 09/02/2022   PLT 345.0 09/02/2022   Last metabolic panel Lab Results  Component Value Date   GLUCOSE 78 09/02/2022   NA 138 09/02/2022   K 4.8 09/02/2022   CL 102 09/02/2022   CO2 28 09/02/2022   BUN 13 09/02/2022   CREATININE 1.06 09/02/2022   GFR 77.32 09/02/2022   CALCIUM 9.7 09/02/2022   PROT 7.8 09/02/2022   ALBUMIN 4.5 09/02/2022   BILITOT 0.5 09/02/2022   ALKPHOS 59 09/02/2022   AST 17 09/02/2022   ALT 21 09/02/2022   ANIONGAP 8 07/07/2020   Last lipids Lab Results  Component Value Date   CHOL 202 (H)  09/02/2022   HDL 39.10 09/02/2022   LDLCALC 134 (H) 07/19/2020   LDLDIRECT 133.0 09/02/2022   TRIG 276.0 (H) 09/02/2022   CHOLHDL 5 09/02/2022      The 89-bzjm ASCVD risk score (Arnett DK, et al., 2019) is: 12.4%    Assessment & Plan:  Problem List Items Addressed This Visit   None Visit Diagnoses       Physical exam    -  Primary   Relevant Orders   Basic metabolic panel with GFR   Lipid panel   CBC with Differential/Platelet   Hepatic function panel   PSA     Lupita has history of hypertension which has been controlled by home readings.  He is maintained currently on valsartan  80 mg daily.  Recommend close continued home monitoring and be in touch if consistent systolic readings over 140.  Continue low saturated fat diet.  -Discussed vaccines.  Patient consents to getting Tdap and Prevnar 20. - Continue annual flu vaccine - Repeat colonoscopy due 2031 - Obtain screening labs as above including PSA  No follow-ups on file.    Wolm Scarlet, MD

## 2023-09-07 ENCOUNTER — Ambulatory Visit: Payer: Self-pay | Admitting: Family Medicine

## 2023-11-26 ENCOUNTER — Ambulatory Visit: Admitting: Family Medicine

## 2023-11-26 ENCOUNTER — Encounter: Payer: Self-pay | Admitting: Family Medicine

## 2023-11-26 VITALS — BP 122/80 | HR 77 | Temp 97.8°F | Wt 179.2 lb

## 2023-11-26 DIAGNOSIS — G4700 Insomnia, unspecified: Secondary | ICD-10-CM

## 2023-11-26 DIAGNOSIS — M25511 Pain in right shoulder: Secondary | ICD-10-CM

## 2023-11-26 MED ORDER — TRAZODONE HCL 50 MG PO TABS: 50.0000 mg | ORAL_TABLET | Freq: Every evening | ORAL | 5 refills | Status: DC | PRN

## 2023-11-26 MED ORDER — PREDNISONE 20 MG PO TABS: ORAL_TABLET | ORAL | 0 refills | Status: AC

## 2023-11-26 NOTE — Progress Notes (Signed)
   Established Patient Office Visit  Subjective   Patient ID: JAIMIE REDDITT, male    DOB: 11-27-1965  Age: 58 y.o. MRN: [id number]  Chief Complaint  Patient presents with   Neck Pain   Shoulder Pain    HPI   Atari is seen today with pain around the right shoulder region for about 3 months.  Denies any injury.  Occasional radiation toward the neck but mostly centered around the deltoid region and worse with abduction.  Somewhat progressive over the past few months.  Denies any radiculitis symptoms.  No upper extremity numbness or weakness.  Is tried Advil  without much relief.  Other issue is insomnia.  He states had months of waking up around 2 to 3 AM and difficulty getting back to sleep.  No regular use of alcohol.  Has tried Benadryl but has not gotten relief with that recently.  No relief with over the counter medications  Past Medical History:  Diagnosis Date   Acute diverticulitis 06/15/2017   w/abscess/notes 06/16/2017   Asthma    Pneumonia 1980s X 1   Seasonal allergies    Stomach ulcer    flares up once in a blue moon; last flareup was in ~ 2018 (06/16/2017)   Past Surgical History:  Procedure Laterality Date   AORTIC VALVE SURGERY  12/16/2017   Blocked bowel   KNEE ARTHROSCOPY Right 12/2016   MCL; ACL    reports that he has never smoked. He has never used smokeless tobacco. He reports current alcohol use. He reports that he does not use drugs. family history includes Alcohol abuse in his father; Cancer (age of onset: 53) in his mother; Diabetes in his brother; Heart disease in his maternal uncle; Heart disease (age of onset: 57) in his mother. No Known Allergies  Review of Systems  Constitutional:  Negative for fever.  Cardiovascular:  Negative for chest pain.  Neurological:  Negative for focal weakness.      Objective:     BP 122/80   Pulse 77   Temp 97.8 F (36.6 C) (Oral)   Wt 179 lb 3.2 oz (81.3 kg)   SpO2 94%   BMI 26.85 kg/m    Physical  Exam Vitals reviewed.  Constitutional:      General: He is not in acute distress.    Appearance: He is not ill-appearing.  Musculoskeletal:     Comments: Right shoulder reveals good range of motion.  He does have some pain with abduction greater than about 80 degrees against resistance.  No significant pain with internal rotation.  Neurological:     Mental Status: He is alert.     Comments: Full strength upper extremity.  No obvious rotator cuff weakness.      No results found for any visits on 11/26/23.    The 10-year ASCVD risk score (Arnett DK, et al., 2019) is: 11.1%    Assessment & Plan:    #1 right shoulder pain.  Duration about 3 months.  No relief with Advil .  Suspect rotator cuff tendinitis and supraspinatus impingement.  Recommend trial of prednisone  20 mg 2 tablets daily for 6 days.  Be in touch if not improving over the next couple weeks and consider sports medicine referral.  Maintain range of motion to avoid adhesive capsulitis  #2 chronic insomnia.  Handout given on sleep hygiene.  Consider trial of trazodone  50 mg nightly   Wolm Scarlet, MD

## 2023-12-19 ENCOUNTER — Other Ambulatory Visit: Payer: Self-pay | Admitting: Family Medicine

## 2024-03-02 ENCOUNTER — Telehealth: Admitting: Family Medicine

## 2024-03-02 DIAGNOSIS — J019 Acute sinusitis, unspecified: Secondary | ICD-10-CM | POA: Diagnosis not present

## 2024-03-02 DIAGNOSIS — B9689 Other specified bacterial agents as the cause of diseases classified elsewhere: Secondary | ICD-10-CM

## 2024-03-02 MED ORDER — AMOXICILLIN-POT CLAVULANATE 875-125 MG PO TABS: 1.0000 | ORAL_TABLET | Freq: Two times a day (BID) | ORAL | 0 refills | Status: AC

## 2024-03-02 NOTE — Patient Instructions (Signed)

## 2024-03-02 NOTE — Progress Notes (Signed)
 " Virtual Visit Consent   Adam Oconnell, you are scheduled for a virtual visit with a [facility] provider today. Just as with appointments in the office, your consent must be obtained to participate. Your consent will be active for this visit and any virtual visit you may have with one of our providers in the next 365 days. If you have a MyChart account, a copy of this consent can be sent to you electronically.  As this is a virtual visit, video technology does not allow for your provider to perform a traditional examination. This may limit your provider's ability to fully assess your condition. If your provider identifies any concerns that need to be evaluated in person or the need to arrange testing (such as labs, EKG, etc.), we will make arrangements to do so. Although advances in technology are sophisticated, we cannot ensure that it will always work on either your end or our end. If the connection with a video visit is poor, the visit may have to be switched to a telephone visit. With either a video or telephone visit, we are not always able to ensure that we have a secure connection.  By engaging in this virtual visit, you consent to the provision of healthcare and authorize for your insurance to be billed (if applicable) for the services provided during this visit. Depending on your insurance coverage, you may receive a charge related to this service.  I need to obtain your verbal consent now. Are you willing to proceed with your visit today? Adam Oconnell has provided verbal consent on 03/02/2024 for a virtual visit (video or telephone). Loa Lamp, FNP  Date: 03/02/2024 2:40 PM   Virtual Visit via Video Note   I, Loa Lamp, connected with  Adam Oconnell  ([id number], [birth year]) on 03/02/2024 at  2:15 PM EST by a video-enabled telemedicine application and verified that I am speaking with the correct person using two identifiers.  Location: Patient: Virtual Visit Location Patient:  Home Provider: Virtual Visit Location Provider: Home Office   I discussed the limitations of evaluation and management by telemedicine and the availability of in person appointments. The patient expressed understanding and agreed to proceed.    History of Present Illness: Adam Oconnell is a 58 y.o. who identifies as a male who was assigned male at birth, and is being seen today for sinus headaches, pain and pressure over maxillary sinuses for a month worsening. Worse over rt eye. No fever, wheezing sob. Cough for 2 days. Mucus is yellow. SABRA  HPI: HPI  Problems:  Patient Active Problem List   Diagnosis Date Noted   Primary hypertension 02/21/2023   H/O small bowel obstruction 12/22/2017   Colonic diverticular abscess 06/15/2017   Dyslipidemia 04/25/2014   Asthma, mild intermittent 07/21/2012   History of peptic ulcer 07/21/2012    Allergies: Allergies[1] Medications: Current Medications[2]  Observations/Objective: Patient is well-developed, well-nourished in no acute distress.  Resting comfortably  at home.  Head is normocephalic, atraumatic.  No labored breathing.  Speech is clear and coherent with logical content.  Patient is alert and oriented at baseline.    Assessment and Plan: 1. Acute bacterial sinusitis (Primary)  Increase fluids, humidifier at night, tylenol  or ibuprofen  as directed, UC as directed,   Follow Up Instructions: I discussed the assessment and treatment plan with the patient. The patient was provided an opportunity to ask questions and all were answered. The patient agreed with the plan and demonstrated an understanding of  the instructions.  A copy of instructions were sent to the patient via MyChart unless otherwise noted below.     The patient was advised to call back or seek an in-person evaluation if the symptoms worsen or if the condition fails to improve as anticipated.    Naethan Bracewell, FNP     [1] No Known Allergies [2]  Current Outpatient  Medications:    amoxicillin -clavulanate (AUGMENTIN ) 875-125 MG tablet, Take 1 tablet by mouth 2 (two) times daily for 10 days., Disp: 20 tablet, Rfl: 0   albuterol  (VENTOLIN  HFA) 108 (90 Base) MCG/ACT inhaler, USE 2 PUFFS EVERY 8 HOURS AS NEEDED FOR COUGHING AND WHEEZING., Disp: 8.5 each, Rfl: 1   ofloxacin  (OCUFLOX ) 0.3 % ophthalmic solution, Place 1 drop into the right eye 4 (four) times daily., Disp: 5 mL, Rfl: 0   Olopatadine HCl (PATADAY OP), Apply to eye., Disp: , Rfl:    Omeprazole Magnesium (PRILOSEC OTC PO), Take by mouth., Disp: , Rfl:    predniSONE  (DELTASONE ) 20 MG tablet, Take two tablets by mouth once daily for 6 days., Disp: 12 tablet, Rfl: 0   traZODone  (DESYREL ) 50 MG tablet, TAKE 1 TABLET BY MOUTH AT BEDTIME AS NEEDED FOR SLEEP., Disp: 90 tablet, Rfl: 3   valsartan  (DIOVAN ) 80 MG tablet, Take 1 tablet (80 mg total) by mouth daily., Disp: 90 tablet, Rfl: 3  "
